# Patient Record
Sex: Female | Born: 1965
Health system: Southern US, Community
[De-identification: ages and names within clinical notes are randomized; demographics above are authoritative.]

## PROBLEM LIST (undated history)

## (undated) DIAGNOSIS — IMO0002 Reserved for concepts with insufficient information to code with codable children: Secondary | ICD-10-CM

## (undated) DIAGNOSIS — T4145XA Adverse effect of unspecified anesthetic, initial encounter: Secondary | ICD-10-CM

## (undated) DIAGNOSIS — R112 Nausea with vomiting, unspecified: Secondary | ICD-10-CM

## (undated) DIAGNOSIS — F419 Anxiety disorder, unspecified: Secondary | ICD-10-CM

## (undated) DIAGNOSIS — G709 Myoneural disorder, unspecified: Secondary | ICD-10-CM

## (undated) DIAGNOSIS — Z9889 Other specified postprocedural states: Secondary | ICD-10-CM

## (undated) DIAGNOSIS — T8859XA Other complications of anesthesia, initial encounter: Secondary | ICD-10-CM

## (undated) DIAGNOSIS — Z87898 Personal history of other specified conditions: Secondary | ICD-10-CM

## (undated) DIAGNOSIS — Z8669 Personal history of other diseases of the nervous system and sense organs: Secondary | ICD-10-CM

## (undated) DIAGNOSIS — B379 Candidiasis, unspecified: Secondary | ICD-10-CM

## (undated) DIAGNOSIS — K589 Irritable bowel syndrome without diarrhea: Secondary | ICD-10-CM

## (undated) HISTORY — DX: Anxiety disorder, unspecified: F41.9

## (undated) HISTORY — PX: WISDOM TOOTH EXTRACTION: SHX21

## (undated) HISTORY — DX: Reserved for concepts with insufficient information to code with codable children: IMO0002

## (undated) HISTORY — DX: Personal history of other diseases of the nervous system and sense organs: Z86.69

## (undated) HISTORY — DX: Candidiasis, unspecified: B37.9

## (undated) HISTORY — DX: Personal history of other specified conditions: Z87.898

## (undated) HISTORY — DX: Irritable bowel syndrome without diarrhea: K58.9

---
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
## 1998-01-28 ENCOUNTER — Ambulatory Visit (HOSPITAL_COMMUNITY): Admission: RE | Admit: 1998-01-28 | Discharge: 1998-01-28 | Payer: Self-pay | Admitting: *Deleted

## 1998-04-24 ENCOUNTER — Other Ambulatory Visit: Admission: RE | Admit: 1998-04-24 | Discharge: 1998-04-24 | Payer: Self-pay | Admitting: *Deleted

## 1999-04-28 ENCOUNTER — Other Ambulatory Visit: Admission: RE | Admit: 1999-04-28 | Discharge: 1999-04-28 | Payer: Self-pay | Admitting: *Deleted

## 2000-07-05 ENCOUNTER — Other Ambulatory Visit: Admission: RE | Admit: 2000-07-05 | Discharge: 2000-07-05 | Payer: Self-pay | Admitting: *Deleted

## 2001-07-27 ENCOUNTER — Other Ambulatory Visit: Admission: RE | Admit: 2001-07-27 | Discharge: 2001-07-27 | Payer: Self-pay | Admitting: *Deleted

## 2002-08-14 ENCOUNTER — Other Ambulatory Visit: Admission: RE | Admit: 2002-08-14 | Discharge: 2002-08-14 | Payer: Self-pay | Admitting: *Deleted

## 2002-10-11 ENCOUNTER — Ambulatory Visit (HOSPITAL_COMMUNITY): Admission: RE | Admit: 2002-10-11 | Discharge: 2002-10-11 | Payer: Self-pay | Admitting: Family Medicine

## 2002-10-11 ENCOUNTER — Encounter: Payer: Self-pay | Admitting: Family Medicine

## 2003-02-27 ENCOUNTER — Other Ambulatory Visit: Admission: RE | Admit: 2003-02-27 | Discharge: 2003-02-27 | Payer: Self-pay | Admitting: *Deleted

## 2003-08-11 DIAGNOSIS — IMO0002 Reserved for concepts with insufficient information to code with codable children: Secondary | ICD-10-CM

## 2003-08-11 DIAGNOSIS — R87619 Unspecified abnormal cytological findings in specimens from cervix uteri: Secondary | ICD-10-CM

## 2003-08-11 DIAGNOSIS — Z8669 Personal history of other diseases of the nervous system and sense organs: Secondary | ICD-10-CM

## 2003-08-11 HISTORY — DX: Reserved for concepts with insufficient information to code with codable children: IMO0002

## 2003-08-11 HISTORY — DX: Unspecified abnormal cytological findings in specimens from cervix uteri: R87.619

## 2003-08-11 HISTORY — DX: Personal history of other diseases of the nervous system and sense organs: Z86.69

## 2003-09-17 ENCOUNTER — Other Ambulatory Visit: Admission: RE | Admit: 2003-09-17 | Discharge: 2003-09-17 | Payer: Self-pay | Admitting: *Deleted

## 2004-12-01 ENCOUNTER — Other Ambulatory Visit: Admission: RE | Admit: 2004-12-01 | Discharge: 2004-12-01 | Payer: Self-pay | Admitting: Obstetrics and Gynecology

## 2005-02-12 ENCOUNTER — Ambulatory Visit (HOSPITAL_COMMUNITY): Admission: RE | Admit: 2005-02-12 | Discharge: 2005-02-12 | Payer: Self-pay | Admitting: Obstetrics and Gynecology

## 2006-01-19 ENCOUNTER — Other Ambulatory Visit: Admission: RE | Admit: 2006-01-19 | Discharge: 2006-01-19 | Payer: Self-pay | Admitting: Obstetrics and Gynecology

## 2006-02-16 ENCOUNTER — Ambulatory Visit (HOSPITAL_COMMUNITY): Admission: RE | Admit: 2006-02-16 | Discharge: 2006-02-16 | Payer: Self-pay | Admitting: Obstetrics and Gynecology

## 2007-02-18 ENCOUNTER — Ambulatory Visit (HOSPITAL_COMMUNITY): Admission: RE | Admit: 2007-02-18 | Discharge: 2007-02-18 | Payer: Self-pay | Admitting: Obstetrics and Gynecology

## 2007-08-11 DIAGNOSIS — K589 Irritable bowel syndrome without diarrhea: Secondary | ICD-10-CM

## 2007-08-11 HISTORY — DX: Irritable bowel syndrome, unspecified: K58.9

## 2008-03-01 ENCOUNTER — Ambulatory Visit (HOSPITAL_COMMUNITY): Admission: RE | Admit: 2008-03-01 | Discharge: 2008-03-01 | Payer: Self-pay | Admitting: Obstetrics and Gynecology

## 2009-08-10 DIAGNOSIS — Z87898 Personal history of other specified conditions: Secondary | ICD-10-CM

## 2009-08-10 HISTORY — DX: Personal history of other specified conditions: Z87.898

## 2010-11-27 ENCOUNTER — Other Ambulatory Visit (HOSPITAL_COMMUNITY): Payer: Self-pay | Admitting: Obstetrics and Gynecology

## 2010-11-27 DIAGNOSIS — Z1231 Encounter for screening mammogram for malignant neoplasm of breast: Secondary | ICD-10-CM

## 2010-11-28 ENCOUNTER — Ambulatory Visit (HOSPITAL_COMMUNITY)
Admission: RE | Admit: 2010-11-28 | Discharge: 2010-11-28 | Disposition: A | Discharge status: Home / Self Care | Payer: 59 | Source: Ambulatory Visit | Attending: Obstetrics and Gynecology | Admitting: Obstetrics and Gynecology

## 2010-11-28 DIAGNOSIS — Z1231 Encounter for screening mammogram for malignant neoplasm of breast: Secondary | ICD-10-CM | POA: Insufficient documentation

## 2011-11-24 ENCOUNTER — Telehealth: Payer: Self-pay | Admitting: Obstetrics and Gynecology

## 2011-11-24 NOTE — Telephone Encounter (Signed)
Routed to chandra 

## 2011-11-25 NOTE — Telephone Encounter (Signed)
LM ON VM TO CB PER TELEPHONE CALL.  

## 2011-11-26 ENCOUNTER — Other Ambulatory Visit: Payer: Self-pay

## 2011-11-26 NOTE — Telephone Encounter (Signed)
TC FROM PT. PT WANTS RX RF FOR YAZ. PT STATES,"TRIED LOLOESTRIN PRESCRIBED BY [facility];HOWEVER D/C X AGO DUE TO INC COST. PT IMMEDIATELY RESTARTED YAZ (PT HAD 2 PACKS LEFT FROM PREV RX) AND IS CURRENTLY TAKING. DENIES MISSING ANY PILLS.  WILL CONSULT WITH [facility] PER RF REQ. PT AGREES. PT NEEDS RF THIS WEEK.

## 2011-11-30 MED ORDER — DROSPIRENONE-ETHINYL ESTRADIOL 3-0.02 MG PO TABS: 1.0000 | ORAL_TABLET | Freq: Every day | ORAL | Status: DC

## 2011-11-30 NOTE — Telephone Encounter (Signed)
TC FROM PT. RX YAZ CALLED TO PHARM WITH RF'S THRU 02/2012. PT AGREES.

## 2012-01-06 ENCOUNTER — Telehealth: Payer: Self-pay | Admitting: Obstetrics and Gynecology

## 2012-01-06 NOTE — Telephone Encounter (Signed)
Triage/cht received 

## 2012-01-06 NOTE — Telephone Encounter (Signed)
[facility] pt 

## 2012-01-07 NOTE — Telephone Encounter (Signed)
Lm with female for pt to cb per telephone call.

## 2012-01-08 NOTE — Telephone Encounter (Signed)
Pc to pt per telephone call. No answer from pt and voicemail not available.

## 2012-01-08 NOTE — Telephone Encounter (Signed)
Lauren Sutton received and addressing.

## 2012-01-08 NOTE — Telephone Encounter (Signed)
Tc from pt. Pt states,"has missed 2 weeks of her bcp's due to forgetting to take them with her on vacation. Pt only uses bcp's for regulation of menses,not bc. Pt advised to wait until next cycle to restart meds.  Pt voices understanding.

## 2012-03-09 ENCOUNTER — Ambulatory Visit (INDEPENDENT_AMBULATORY_CARE_PROVIDER_SITE_OTHER): Payer: 59 | Admitting: Obstetrics and Gynecology

## 2012-03-09 ENCOUNTER — Encounter: Payer: Self-pay | Admitting: Obstetrics and Gynecology

## 2012-03-09 VITALS — BP 102/60 | Ht 60.5 in | Wt 132.0 lb

## 2012-03-09 DIAGNOSIS — B49 Unspecified mycosis: Secondary | ICD-10-CM

## 2012-03-09 DIAGNOSIS — R6889 Other general symptoms and signs: Secondary | ICD-10-CM

## 2012-03-09 DIAGNOSIS — F411 Generalized anxiety disorder: Secondary | ICD-10-CM

## 2012-03-09 DIAGNOSIS — K589 Irritable bowel syndrome without diarrhea: Secondary | ICD-10-CM | POA: Insufficient documentation

## 2012-03-09 DIAGNOSIS — B379 Candidiasis, unspecified: Secondary | ICD-10-CM | POA: Insufficient documentation

## 2012-03-09 DIAGNOSIS — F419 Anxiety disorder, unspecified: Secondary | ICD-10-CM | POA: Insufficient documentation

## 2012-03-09 DIAGNOSIS — IMO0002 Reserved for concepts with insufficient information to code with codable children: Secondary | ICD-10-CM

## 2012-03-09 DIAGNOSIS — Z87898 Personal history of other specified conditions: Secondary | ICD-10-CM

## 2012-03-09 DIAGNOSIS — Z8669 Personal history of other diseases of the nervous system and sense organs: Secondary | ICD-10-CM | POA: Insufficient documentation

## 2012-03-09 DIAGNOSIS — N6459 Other signs and symptoms in breast: Secondary | ICD-10-CM

## 2012-03-09 NOTE — Progress Notes (Signed)
AEX  Last Pap: 02/26/2010 WNL: Yes  No hx of abnl Regular Periods:yes Contraception: None  Monthly Breast exam:no Tetanus<75yrs: unsure Nl.Bladder Function:yes Daily BMs:yes Healthy Diet:yes Calcium:no Mammogram:yes Date of Mammogram: 11/28/2010 Exercise:yes Have often Exercise: 6 times weekly Seatbelt: yes Abuse at home: no Stressful work:yes Sigmoid-colonoscopy: n/a Bone Density: No PCP: Mary Previst Change in PMH: none Change in [facility]: none Subjective:    Lauren Sutton is a 46 y.o. female, G2P2002, who presents for an annual exam. She denies any hot flashes, has some insomnia occassionally    History   Social History  . Marital Status: Married    Spouse Name: N/A    Number of Children: N/A  . Years of Education: N/A   Social History Main Topics  . Smoking status: Never Smoker   . Smokeless tobacco: Never Used  . Alcohol Use: No  . Drug Use: No  . Sexually Active: Yes    Birth Control/ Protection: None   Other Topics Concern  . None   Social History Narrative  . None    Menstrual cycle:   LMP: Patient's last menstrual period was 02/19/2012.           Cycle: Very light.  Pt decided to d/c BCPs because she doesn't need contraception.    The following portions of the patient's history were reviewed and updated as appropriate: allergies, current medications, past family history, past medical history, past social history, past surgical history and problem list.  Review of Systems Pertinent items are noted in HPI. Breast:Negative for breast lump,nipple discharge or nipple retraction Gastrointestinal: Negative for abdominal pain, change in bowel habits or rectal bleeding Urinary:negative   Objective:    BP 102/60  Ht 5' 0.5" (1.537 m)  Wt 132 lb (59.875 kg)  BMI 25.36 kg/m2  LMP 02/19/2012    Weight:  Wt Readings from Last 1 Encounters:  03/09/12 132 lb (59.875 kg)          BMI: Body mass index is 25.36 kg/(m^2).  General Appearance: Alert,  appropriate appearance for age. No acute distress HEENT: Grossly normal Neck / Thyroid: Supple, no masses, nodes or enlargement Lungs: clear to auscultation bilaterally Back: No CVA tenderness Breast Exam: left breast with irregularity along the outer day which at approximately 2:00 with mild tenderness. No other breast irregularity. No dominant masses discharge skin changes. Cardiovascular: Regular rate and rhythm. S1, S2, no murmur Gastrointestinal: Soft, non-tender, no masses or organomegaly Pelvic Exam: External genitalia: normal general appearance Vaginal: normal mucosa without prolapse or lesions Cervix: normal appearance Adnexa: normal bimanual exam Uterus: normal single, nontender Rectovaginal: normal rectal, no masses Lymphatic Exam: Non-palpable nodes in neck, clavicular, axillary, or inguinal regions Skin: no rash or abnormalities Neurologic: Normal gait and speech, no tremor  Psychiatric: Alert and oriented, appropriate affect.   Wet Prep:not applicable Urinalysis:not applicable UPT: Not done   Assessment:    Normal gyn exam perimenopausal timeframe without symptoms.    Plan:    Mammogram, diagnostic ordered pap smear due 2014 return annually or prn STD screening: declined Contraception:no method declined      Camaron Cammack PMD

## 2012-03-17 ENCOUNTER — Ambulatory Visit: Payer: Self-pay | Admitting: Obstetrics and Gynecology

## 2012-03-22 ENCOUNTER — Other Ambulatory Visit: Payer: Self-pay | Admitting: Obstetrics and Gynecology

## 2012-03-22 ENCOUNTER — Ambulatory Visit
Admission: RE | Admit: 2012-03-22 | Discharge: 2012-03-22 | Disposition: A | Discharge status: Home / Self Care | Payer: 59 | Source: Ambulatory Visit | Attending: Obstetrics and Gynecology | Admitting: Obstetrics and Gynecology

## 2012-03-22 DIAGNOSIS — N6459 Other signs and symptoms in breast: Secondary | ICD-10-CM

## 2013-08-29 ENCOUNTER — Other Ambulatory Visit: Payer: Self-pay | Admitting: Obstetrics and Gynecology

## 2013-08-29 DIAGNOSIS — Z1231 Encounter for screening mammogram for malignant neoplasm of breast: Secondary | ICD-10-CM

## 2013-09-01 ENCOUNTER — Ambulatory Visit (HOSPITAL_COMMUNITY)
Admission: RE | Admit: 2013-09-01 | Discharge: 2013-09-01 | Disposition: A | Discharge status: Home / Self Care | Payer: 59 | Source: Ambulatory Visit | Attending: Obstetrics and Gynecology | Admitting: Obstetrics and Gynecology

## 2013-09-01 DIAGNOSIS — Z1231 Encounter for screening mammogram for malignant neoplasm of breast: Secondary | ICD-10-CM

## 2014-06-11 ENCOUNTER — Encounter: Payer: Self-pay | Admitting: Obstetrics and Gynecology

## 2016-05-12 ENCOUNTER — Ambulatory Visit (INDEPENDENT_AMBULATORY_CARE_PROVIDER_SITE_OTHER): Payer: Managed Care, Other (non HMO) | Admitting: Family Medicine

## 2016-05-12 ENCOUNTER — Encounter: Payer: Self-pay | Admitting: Family Medicine

## 2016-05-12 VITALS — BP 98/64 | HR 86 | Temp 97.7°F | Resp 12 | Ht 60.5 in | Wt 132.0 lb

## 2016-05-12 DIAGNOSIS — Z1211 Encounter for screening for malignant neoplasm of colon: Secondary | ICD-10-CM

## 2016-05-12 DIAGNOSIS — Z0000 Encounter for general adult medical examination without abnormal findings: Secondary | ICD-10-CM

## 2016-05-12 DIAGNOSIS — Z13220 Encounter for screening for lipoid disorders: Secondary | ICD-10-CM | POA: Diagnosis not present

## 2016-05-12 DIAGNOSIS — Z23 Encounter for immunization: Secondary | ICD-10-CM | POA: Diagnosis not present

## 2016-05-12 DIAGNOSIS — Z1159 Encounter for screening for other viral diseases: Secondary | ICD-10-CM

## 2016-05-12 DIAGNOSIS — Z131 Encounter for screening for diabetes mellitus: Secondary | ICD-10-CM | POA: Diagnosis not present

## 2016-05-12 NOTE — Progress Notes (Signed)
Pre visit review using our clinic review tool, if applicable. No additional management support is needed unless otherwise documented below in the visit note. 

## 2016-05-12 NOTE — Patient Instructions (Addendum)
A few things to remember from today's visit:   Routine general medical examination at a health care facility - Plan: Lipid panel, Basic Metabolic Panel, Hemoglobin A1c  Lipid screening - Plan: Lipid panel  Diabetes mellitus screening - Plan: Basic Metabolic Panel, Hemoglobin A1c  Colon cancer screening - Plan: Ambulatory referral to Gastroenterology    At least 150 minutes of moderate exercise per week, daily brisk walking for 15-30 min is a good exercise option. Healthy diet low in saturated (animal) fats and sweets and consisting of fresh fruits and vegetables, lean meats such as fish and white chicken and whole grains.   - Vaccines:  Tdap vaccine every 10 years. Given today. Consider annual flu vaccine.  Shingles vaccine recommended at age 52, could be given after 52 years of age but not sure about insurance coverage.  Pneumonia vaccines:  Prevnar 13 at 65 and Pneumovax at 66.  Screening recommendations for low/normal risk women:  Screening for diabetes at age 52-45 and every 3 years.  Cervical cancer prevention:   Pap smear every 3-5 years between women 30 and older if pap smear negative and HPV screening negative. Continue following with gyn.  -Breast cancer: Mammogram: There is disagreement between experts about when to start screening in low risk asymptomatic female but recent recommendations are to start screening at 6840 and not later than 52 years old , every 1-2 years and after 52 yo q 2 years. Screening is recommended until 52 years old but some women can continue screening depending of healthy issues.   Colon cancer screening: starts at 52 years old until 52 years old. Colonoscopy is the ideal, if normal it can be repeated every 10 years.  Immunochemical test (FIT): Hemoccult cards, very sensitive if all 3 cards are done. If negative it can be repeated annually. If positive a colonoscopy should follow.   Cologuard: Intended for patients with average risk factors  for colon cancer from 4950-52 years old. Anyone with prior history of colon polyps or family history of colonic neoplasia is NOT average risk. May be repeated every 3 years.  It is not as sensitive in older patients. Cost is around  $ 649. If positive a diagnostic colonoscopy must be done.    Cholesterol disorder screening at age 52 and every 3 years.     If a new problem present, please set up appointment sooner than planned today.

## 2016-05-12 NOTE — Progress Notes (Signed)
HPI:   [name] is a 50 y.o. female, who is here today to establish care with me, she also was planning on her routine physical today.    Former PCP: N/A Last preventive routine visit: 7-8 years ago  She follows with her gynecologist annually, Lauren Sutton. According to patient,  in the past she has had some blood work done and has been otherwise negative.  She is overall healthy, she is not on chronic medications.  Concerns today: none  Lives with husband and a daughter.  She exercises regularly, walks daily, and tries to follow a healthy diet.  Colonoscopy: has not had one before.  Denies abdominal pain, nausea, vomiting, changes in bowel habits, blood in stool or melena.  Tdap not sure. She is not interested in influenza vaccine.    Review of Systems  Constitutional: Negative for appetite change, fatigue and fever.  HENT: Negative for hearing loss, mouth sores, trouble swallowing and voice change.   Eyes: Negative for photophobia and visual disturbance.  Respiratory: Negative for cough, shortness of breath and wheezing.   Cardiovascular: Negative for chest pain and leg swelling.  Gastrointestinal: Negative for abdominal pain, nausea and vomiting.       No changes in bowel habits.  Endocrine: Negative for cold intolerance and heat intolerance.  Genitourinary: Negative for decreased urine volume, dysuria, hematuria, vaginal bleeding and vaginal discharge.  Musculoskeletal: Negative for arthralgias, back pain and neck pain.  Skin: Negative for color change and rash.  Neurological: Negative for seizures, syncope, weakness, numbness and headaches.  Psychiatric/Behavioral: Negative for confusion and sleep disturbance. The patient is not nervous/anxious.   All other systems reviewed and are negative.     No current outpatient prescriptions on file prior to visit.   No current facility-administered medications on file prior to visit.      Past Medical  History:  Diagnosis Date  . Abnormal Pap smear 2005  . Anxiety   . H/O insomnia 2011  . H/O migraine 2005  . H/O migraine   . IBS (irritable bowel syndrome) 2009  . Yeast infection    No Known Allergies  Family History  Problem Relation Age of Onset  . Cancer Paternal Grandmother     Breast  . Hypertension Mother   . Stroke Mother     Social History   Social History  . Marital status: Married    Spouse name: N/A  . Number of children: N/A  . Years of education: N/A   Social History Main Topics  . Smoking status: Never Smoker  . Smokeless tobacco: Never Used  . Alcohol use No  . Drug use: No  . Sexual activity: Yes    Birth control/ protection: None   Other Topics Concern  . None   Social History Narrative  . None    Vitals:   05/12/16 0756  BP: 98/64  Pulse: 86  Resp: 12  Temp: 97.7 F (36.5 C)   O2 sat at RA 97%  Body mass index is 25.36 kg/m.    Physical Exam  Nursing note and vitals reviewed. Constitutional: She is oriented to person, place, and time. She appears well-developed. No distress.  HENT:  Head: Atraumatic.  Right Ear: Hearing, tympanic membrane, external ear and ear canal normal.  Left Ear: Hearing, tympanic membrane, external ear and ear canal normal.  Mouth/Throat: Uvula is midline, oropharynx is clear and moist and mucous membranes are normal.  Eyes: Conjunctivae and EOM are normal.  Pupils are equal, round, and reactive to light.  Neck: No thyroid mass and no thyromegaly present.  Cardiovascular: Normal rate and regular rhythm.   No murmur heard. Pulses:      Dorsalis pedis pulses are 2+ on the right side, and 2+ on the left side.       Posterior tibial pulses are 2+ on the right side, and 2+ on the left side.  Respiratory: Effort normal and breath sounds normal. No respiratory distress.  GI: Soft. She exhibits no mass. There is no hepatomegaly. There is no tenderness.  Musculoskeletal: She exhibits no edema or tenderness.    No major deformity or sing of synovitis appreciated.  Lymphadenopathy:    She has no cervical adenopathy.       Right: No supraclavicular adenopathy present.       Left: No supraclavicular adenopathy present.  Neurological: She is alert and oriented to person, place, and time. She has normal strength. No cranial nerve deficit. Coordination and gait normal.  Reflex Scores:      Bicep reflexes are 2+ on the right side and 2+ on the left side.      Patellar reflexes are 2+ on the right side and 2+ on the left side. Skin: Skin is warm. No rash noted. No erythema.  Psychiatric: She has a normal mood and affect. Her speech is normal.  Well groomed, good eye contact.      ASSESSMENT AND PLAN:     Lauren SorrowOlga was seen today for new patient (initial visit).  Diagnoses and all orders for this visit:  Routine general medical examination at a health care facility   We discussed the importance of regular physical activity and healthy diet for prevention of chronic illness and/or complications. Preventive guidelines reviewed. Continue following with gyn for female prevention care. Vaccination updated, does not want Flu vaccine. Evidence about Aspirin for primary prevention discussed. Ca++ and vit D supplementation recommended. Next CPE in 1-2 years.   -     Lipid panel; Future -     Basic Metabolic Panel; Future -     Hemoglobin A1c; Future -     Hep C Antibody; Future  Lipid screening -     Cancel: Lipid panel -     Lipid panel; Future  Diabetes mellitus screening -     Cancel: Basic Metabolic Panel -     Cancel: Hemoglobin A1c -     Basic Metabolic Panel; Future -     Hemoglobin A1c; Future  Colon cancer screening -     Ambulatory referral to Gastroenterology  Need for Tdap vaccination -     Tdap vaccine greater than or equal to 7yo IM  Encounter for hepatitis C screening test for low risk patient  -     Hep C Antibody; Future    -Today she is not fasting, so she will  be back for fasting labs next week.    Mostyn Varnell G. [place], MD  [facility]. [place] office.

## 2016-06-01 ENCOUNTER — Other Ambulatory Visit (INDEPENDENT_AMBULATORY_CARE_PROVIDER_SITE_OTHER): Payer: Managed Care, Other (non HMO)

## 2016-06-01 DIAGNOSIS — Z13220 Encounter for screening for lipoid disorders: Secondary | ICD-10-CM | POA: Diagnosis not present

## 2016-06-01 DIAGNOSIS — Z1159 Encounter for screening for other viral diseases: Secondary | ICD-10-CM

## 2016-06-01 DIAGNOSIS — Z131 Encounter for screening for diabetes mellitus: Secondary | ICD-10-CM

## 2016-06-01 DIAGNOSIS — Z0000 Encounter for general adult medical examination without abnormal findings: Secondary | ICD-10-CM | POA: Diagnosis not present

## 2016-06-01 LAB — LIPID PANEL
CHOL/HDL RATIO: 3
Cholesterol: 190 mg/dL (ref 0–200)
HDL: 61.2 mg/dL (ref >39.00)
LDL CALC: 114 mg/dL — AB (ref 0–99)
NONHDL: 128.52
TRIGLYCERIDES: 71 mg/dL (ref 0.0–149.0)
VLDL: 14.2 mg/dL (ref 0.0–40.0)

## 2016-06-01 LAB — BASIC METABOLIC PANEL
BUN: 16 mg/dL (ref 6–23)
CALCIUM: 9.5 mg/dL (ref 8.4–10.5)
CHLORIDE: 104 meq/L (ref 96–112)
CO2: 29 mEq/L (ref 19–32)
CREATININE: 0.79 mg/dL (ref 0.40–1.20)
GFR: 80.98 mL/min (ref >60.00)
Glucose, Bld: 86 mg/dL (ref 70–99)
Potassium: 4.3 mEq/L (ref 3.5–5.1)
Sodium: 139 mEq/L (ref 135–145)

## 2016-06-01 LAB — HEPATITIS C ANTIBODY: HCV AB: NEGATIVE

## 2016-06-01 LAB — HEMOGLOBIN A1C: HEMOGLOBIN A1C: 5.8 % (ref 4.6–6.5)

## 2016-06-12 ENCOUNTER — Encounter: Payer: Self-pay | Admitting: Family Medicine

## 2017-02-02 ENCOUNTER — Encounter: Payer: Self-pay | Admitting: Family Medicine

## 2017-02-02 ENCOUNTER — Ambulatory Visit (INDEPENDENT_AMBULATORY_CARE_PROVIDER_SITE_OTHER): Payer: Managed Care, Other (non HMO) | Admitting: Family Medicine

## 2017-02-02 VITALS — BP 112/70 | HR 67 | Temp 97.7°F | Resp 12 | Ht 60.5 in | Wt 143.0 lb

## 2017-02-02 DIAGNOSIS — J069 Acute upper respiratory infection, unspecified: Secondary | ICD-10-CM | POA: Diagnosis not present

## 2017-02-02 DIAGNOSIS — R0981 Nasal congestion: Secondary | ICD-10-CM

## 2017-02-02 MED ORDER — AMOXICILLIN-POT CLAVULANATE 875-125 MG PO TABS: 1.0000 | ORAL_TABLET | Freq: Two times a day (BID) | ORAL | 0 refills | Status: AC

## 2017-02-02 MED ORDER — PREDNISONE 20 MG PO TABS: 40.0000 mg | ORAL_TABLET | Freq: Every day | ORAL | 0 refills | Status: AC

## 2017-02-02 NOTE — Patient Instructions (Signed)
A few things to remember from today's visit:   Nasal sinus congestion - Plan: predniSONE (DELTASONE) 20 MG tablet  URI, acute  viral infections are self-limited and we treat each symptom depending of severity.  Over the counter medications as decongestants and cold medications usually help, they need to be taken with caution if there is a history of high blood pressure or palpitations. Tylenol and/or Ibuprofen also helps with most symptoms (headache, muscle aching, fever,etc) Plenty of fluids. Honey helps with cough. Steam inhalations helps with runny nose, nasal congestion, and may prevent sinus infections. Cough and nasal congestion could last a few days and sometimes weeks. Please follow in not any better in 2 weeks or if symptoms get worse.   Viral infections do not respond to antibiotic treatments and DO NOT prevent for bacterial over infection. A viral illness can cause fever that last 4-7 days in average.  Antibiotics also have side effects that go from gastrointestinal symptoms (diarrhea,nausea,bloating sensation) to bacterial resistance , and even serious illness as C. Diff  Infections.  If you are not feeling ANY better in 3-4 days you can fill antibiotic,even thought viral illness can take longer to resolve.  Over-the-counter antihistaminic, Allegra 180 mg daily. Steroid nasal spray, Flonase 1 spray in each nostril twice daily.  Please be sure medication list is accurate.

## 2017-02-02 NOTE — Progress Notes (Signed)
HPI:  ACUTE VISIT  Chief Complaint  Patient presents with  . Sinusitis    Lauren Sutton is a 51 y.o.female here today complaining of 5 days of "head congestion." She thinks she may have a sinus infection.  Her daughter was recently seen with similar symptoms and prescribed abx treatment. Problem started with sore throat, which have improved.  URI   This is a new problem. The current episode started in the past 7 days. The problem has been gradually worsening. There has been no fever. Associated symptoms include congestion, coughing, ear pain, headaches (pressure sensation), rhinorrhea and a sore throat. Pertinent negatives include no abdominal pain, diarrhea, nausea, neck pain, rash, swollen glands, vomiting or wheezing. She has tried decongestant for the symptoms. The treatment provided no relief.   Pressure frontal headache, worse in the morning, mild. No associated nausea, vomiting, or visual changes. "Little" non productive cough.  No Hx of recent travel. No known insect bite.  Hx of allergies: Denies  OTC medications for this problem: Alka Seltzer cold and sinus and nasal Afrin.   Review of Systems  Constitutional: Positive for fatigue. Negative for activity change, appetite change and fever.  HENT: Positive for congestion, ear pain, postnasal drip, rhinorrhea, sinus pressure and sore throat. Negative for facial swelling, hearing loss, mouth sores, trouble swallowing and voice change.   Eyes: Negative for discharge, redness and visual disturbance.  Respiratory: Positive for cough. Negative for chest tightness, shortness of breath and wheezing.   Gastrointestinal: Negative for abdominal pain, diarrhea, nausea and vomiting.  Musculoskeletal: Negative for myalgias and neck pain.  Skin: Negative for rash.  Allergic/Immunologic: Negative for environmental allergies.  Neurological: Positive for headaches (pressure sensation). Negative for syncope and weakness.    Hematological: Negative for adenopathy. Does not bruise/bleed easily.  Psychiatric/Behavioral: Negative for confusion. The patient is nervous/anxious.     No current outpatient prescriptions on file prior to visit.   No current facility-administered medications on file prior to visit.      Past Medical History:  Diagnosis Date  . Abnormal Pap smear 2005  . Anxiety   . H/O insomnia 2011  . H/O migraine 2005  . H/O migraine   . IBS (irritable bowel syndrome) 2009  . Yeast infection    No Known Allergies  Social History   Social History  . Marital status: Married    Spouse name: N/A  . Number of children: N/A  . Years of education: N/A   Social History Main Topics  . Smoking status: Never Smoker  . Smokeless tobacco: Never Used  . Alcohol use No  . Drug use: No  . Sexual activity: Yes    Birth control/ protection: None   Other Topics Concern  . None   Social History Narrative  . None    Vitals:   02/02/17 1557  BP: 112/70  Pulse: 67  Resp: 12  Temp: 97.7 F (36.5 C)  O2 sat at RA 96% Body mass index is 27.47 kg/m.  Physical Exam  Nursing note and vitals reviewed. Constitutional: She is oriented to person, place, and time. She appears well-developed. She does not appear ill. No distress.  HENT:  Head: Atraumatic.  Right Ear: Tympanic membrane, external ear and ear canal normal.  Left Ear: Tympanic membrane, external ear and ear canal normal.  Nose: Rhinorrhea present. Right sinus exhibits no maxillary sinus tenderness and no frontal sinus tenderness. Left sinus exhibits no maxillary sinus tenderness and no frontal sinus  tenderness.  Mouth/Throat: Oropharynx is clear and moist and mucous membranes are normal.  Hypertrophic turbinates. Postnasal drainage.  Eyes: Conjunctivae and EOM are normal.  Neck: No muscular tenderness present. No edema and no erythema present.  Cardiovascular: Normal rate and regular rhythm.   No murmur heard. Respiratory:  Effort normal and breath sounds normal. No stridor. No respiratory distress.  Lymphadenopathy:       Head (right side): No submandibular adenopathy present.       Head (left side): No submandibular adenopathy present.    She has cervical adenopathy (< 1 cm, no tender).       Right cervical: Posterior cervical adenopathy present.       Left cervical: Posterior cervical adenopathy present.  Neurological: She is alert and oriented to person, place, and time. She has normal strength. Gait normal.  Skin: Skin is warm. No rash noted. No erythema.  Psychiatric: She has a normal mood and affect.  Well groomed, good eye contact.     ASSESSMENT AND PLAN:   Diagnoses and all orders for this visit:  Nasal sinus congestion  She denies history of allergies but some findings on examination suggest possible allergic rhinitis. Explained that nasal congestion and cough after a URI my last a few weeks. I don't think antibiotics are necessary at this time. She still would like antibiotic prescription to be sent to the pharmacy, instructed not to fill Rx now, if not any better in 3-4 days or new onset of fever she can go ahead and fill prescription. She was educated about some side effects of antibiotic use. Prednisone might help, some side effects discussed. Also recommend OTC antihistaminic. Follow-up as needed.   -     predniSONE (DELTASONE) 20 MG tablet; Take 2 tablets (40 mg total) by mouth daily with breakfast.  URI, acute  Symptoms suggests a viral etiology, I explained patient that symptomatic treatment is usually recommended in this case, so I do not think abx will help. Instructed to monitor for signs of complications, instructed about warning signs.  F/U as needed.   Other orders -     amoxicillin-clavulanate (AUGMENTIN) 875-125 MG tablet; Take 1 tablet by mouth 2 (two) times daily.   25 min face to face OV. > 50% was dedicated to discussion of Dx, prognosis, treatment options, and  side effects of medications.Educated about treatment of viral illness and reassurance.    -Ms. Lauren Sutton was advised to seek attention immediately if symptoms worsen or to follow if they persist or new concerns arise.       Ainslee Sou G. [place], MD  [facility]. [place] office.

## 2017-03-15 ENCOUNTER — Encounter (HOSPITAL_COMMUNITY): Payer: Self-pay | Admitting: Emergency Medicine

## 2017-03-15 ENCOUNTER — Emergency Department (HOSPITAL_COMMUNITY)
Admission: EM | Admit: 2017-03-15 | Discharge: 2017-03-15 | Disposition: A | Discharge status: Home / Self Care | Payer: Managed Care, Other (non HMO) | Attending: Emergency Medicine | Admitting: Emergency Medicine

## 2017-03-15 DIAGNOSIS — S0185XA Open bite of other part of head, initial encounter: Secondary | ICD-10-CM

## 2017-03-15 DIAGNOSIS — Y929 Unspecified place or not applicable: Secondary | ICD-10-CM | POA: Insufficient documentation

## 2017-03-15 DIAGNOSIS — W540XXA Bitten by dog, initial encounter: Secondary | ICD-10-CM | POA: Diagnosis not present

## 2017-03-15 DIAGNOSIS — Z23 Encounter for immunization: Secondary | ICD-10-CM | POA: Insufficient documentation

## 2017-03-15 DIAGNOSIS — Y999 Unspecified external cause status: Secondary | ICD-10-CM | POA: Insufficient documentation

## 2017-03-15 DIAGNOSIS — S0181XA Laceration without foreign body of other part of head, initial encounter: Secondary | ICD-10-CM | POA: Diagnosis not present

## 2017-03-15 DIAGNOSIS — Y939 Activity, unspecified: Secondary | ICD-10-CM | POA: Insufficient documentation

## 2017-03-15 DIAGNOSIS — Z203 Contact with and (suspected) exposure to rabies: Secondary | ICD-10-CM | POA: Insufficient documentation

## 2017-03-15 LAB — I-STAT BETA HCG BLOOD, ED (MC, WL, AP ONLY): I-stat hCG, quantitative: <5 m[IU]/mL (ref <5)

## 2017-03-15 MED ORDER — RABIES IMMUNE GLOBULIN 150 UNIT/ML IM INJ
20.0000 [IU]/kg | INJECTION | Freq: Once | INTRAMUSCULAR | Status: AC
  Administered 2017-03-15: 1275 [IU] via INTRAMUSCULAR
  Filled 2017-03-15: qty 8.5

## 2017-03-15 MED ORDER — RABIES VACCINE, PCEC IM SUSR
1.0000 mL | Freq: Once | INTRAMUSCULAR | Status: AC
  Administered 2017-03-15: 1 mL via INTRAMUSCULAR
  Filled 2017-03-15: qty 1

## 2017-03-15 MED ORDER — TETANUS-DIPHTH-ACELL PERTUSSIS 5-2.5-18.5 LF-MCG/0.5 IM SUSP
0.5000 mL | Freq: Once | INTRAMUSCULAR | Status: AC
  Administered 2017-03-15: 0.5 mL via INTRAMUSCULAR
  Filled 2017-03-15: qty 0.5

## 2017-03-15 MED ORDER — AMOXICILLIN-POT CLAVULANATE 875-125 MG PO TABS
1.0000 | ORAL_TABLET | Freq: Once | ORAL | Status: AC
  Administered 2017-03-15: 1 via ORAL
  Filled 2017-03-15: qty 1

## 2017-03-15 MED ORDER — AMOXICILLIN-POT CLAVULANATE 875-125 MG PO TABS: 1.0000 | ORAL_TABLET | Freq: Two times a day (BID) | ORAL | 0 refills | Status: DC

## 2017-03-15 NOTE — ED Provider Notes (Signed)
[facility]-EMERGENCY DEPT Provider Note   CSN: [id number] Arrival date & time: 03/15/17  1007  By signing my name below, I, Rosana Fretana Waskiewicz, attest that this documentation has been prepared under the direction and in the presence of non-physician practitioner, Lyndel SafeHammond, Elizabeth, PA-C. Electronically Signed: Rosana Fretana Waskiewicz, ED Scribe. 03/15/17. 12:07 PM.  History   Chief Complaint Chief Complaint  Patient presents with  . Animal Bite   The history is provided by the patient.   HPI Comments: Lauren Sutton is a 51 y.o. female who presents to the Emergency Department requesting evaluation of a dog bite sustained earlier today. Pt states that the dog was abandoned and it bit her when she was feeding it. The dogs vaccination status is unknown. The pt's tetanus status is unknown. Pt's wounds are located just right of the nose and on the left side of the chin. No other complaints at this time.  Past Medical History:  Diagnosis Date  . Abnormal Pap smear 2005  . Anxiety   . H/O insomnia 2011  . H/O migraine 2005  . H/O migraine   . IBS (irritable bowel syndrome) 2009  . Yeast infection     Patient Active Problem List   Diagnosis Date Noted  . Abnormal Pap smear   . Yeast infection   . H/O migraine   . IBS (irritable bowel syndrome)   . Anxiety   . H/O insomnia     Past Surgical History:  Procedure Laterality Date  . WISDOM TOOTH EXTRACTION      OB History    Gravida Para Term Preterm AB Living   2 2 2     2    SAB TAB Ectopic Multiple Live Births           2       Home Medications    Prior to Admission medications   Medication Sig Start Date End Date Taking? Authorizing Provider  amoxicillin-clavulanate (AUGMENTIN) 875-125 MG tablet Take 1 tablet by mouth every 12 (twelve) hours. 03/15/17   Cristina GongHammond, Elizabeth W, PA-C    Family History Family History  Problem Relation Age of Onset  . Cancer Paternal Grandmother        Breast  . Hypertension Mother   . Stroke Mother       Social History Social History  Substance Use Topics  . Smoking status: Never Smoker  . Smokeless tobacco: Never Used  . Alcohol use No     Allergies   Patient has no known allergies.   Review of Systems Review of Systems  Constitutional: Negative for fever.  HENT: Negative for dental problem and facial swelling.   Skin: Positive for wound. Negative for color change and pallor.  Neurological: Negative for weakness and numbness.     Physical Exam Updated Vital Signs BP 125/82 (BP Location: Left Arm)   Pulse 77   Temp 98.2 F (36.8 C) (Oral)   Resp 18   Ht 5' 2.5" (1.588 m)   Wt 64 kg (141 lb 3.2 oz)   SpO2 97%   BMI 25.41 kg/m   Physical Exam  Constitutional: She appears well-developed and well-nourished. No distress.  HENT:  Head: Normocephalic.  Right Ear: External ear normal.  Left Ear: External ear normal.  Eyes: No scleral icterus.  Neck: Normal range of motion. No tracheal deviation present.  Pulmonary/Chest: Effort normal. No stridor.  Abdominal: She exhibits no distension.  Neurological: She is alert. No sensory deficit.  Skin: Skin is warm and dry. No  pallor.  5 mm laceration to the right alar crease. 5 mm puncture to the left jaw.   Psychiatric: She has a normal mood and affect. Her behavior is normal.  Nursing note and vitals reviewed.    ED Treatments / Results  DIAGNOSTIC STUDIES: Oxygen Saturation is 97% on RA, normal by my interpretation.   COORDINATION OF CARE: 12:01 PM-Discussed next steps with pt including updating Tetanus and rabies vaccinations. Pt verbalized understanding and is agreeable with the plan.   Labs (all labs ordered are listed, but only abnormal results are displayed) Labs Reviewed  I-STAT BETA HCG BLOOD, ED ([facility], [facility], [facility] ONLY)    EKG  EKG Interpretation None       Radiology No results found.  Procedures Procedures (including critical care time)  Medications Ordered in ED Medications  rabies immune  globulin (HYPERAB/KEDRAB) injection 1,275 Units (1,275 Units Intramuscular Given 03/15/17 1259)  rabies vaccine (RABAVERT) injection 1 mL (1 mL Intramuscular Given 03/15/17 1252)  Tdap (BOOSTRIX) injection 0.5 mL (0.5 mLs Intramuscular Given 03/15/17 1309)  amoxicillin-clavulanate (AUGMENTIN) 875-125 MG per tablet 1 tablet (1 tablet Oral Given 03/15/17 1311)     Initial Impression / Assessment and Plan / ED Course  I have reviewed the triage vital signs and the nursing notes.  Pertinent labs & imaging results that were available during my care of the patient were reviewed by me and considered in my medical decision making (see chart for details).    Patient presents with laceration from a dog bite.  Pt wounds irrigated and cleaned.  Wounds examined with visualization of the base and no foreign bodies seen.  Pt Alert and oriented, NAD, nontoxic, nonseptic appearing.  pt without neurologic deficit. Patient tetanus updated.  Patient rabies vaccine and immunoglobulin risk and benefit discussed.  Pt consents. Wounds not closed secondary to concern for infection. We'll discharge home with pain medication, Augmentin and requests for close follow-up with PCP or back in the ER.  Patient was given letter with specific date stating when she needs to receive additional rabies vaccines. Patient was given the option to ask questions, all of which were answered to the best of my abilities.   Final Clinical Impressions(s) / ED Diagnoses   Final diagnoses:  Dog bite of face, initial encounter    New Prescriptions Discharge Medication List as of 03/15/2017  1:13 PM    START taking these medications   Details  amoxicillin-clavulanate (AUGMENTIN) 875-125 MG tablet Take 1 tablet by mouth every 12 (twelve) hours., Starting Mon 03/15/2017, Print       I personally performed the services described in this documentation, which was scribed in my presence. The recorded information has been reviewed and is accurate.        Cristina Gong, PA-C 03/15/17 1357    Rolland Porter, MD 03/18/17 1037

## 2017-03-15 NOTE — ED Triage Notes (Signed)
Pt here s/t dog bite to face today. Was bit by abandoned pit bull mix that was tied up and abandoned.  Tetanus vaccine unknown. Vaccination status of dog unknown. Shallow minor puncture wounds to right lateral nose and left jaw.

## 2017-03-15 NOTE — ED Notes (Signed)
Bed: WTR5 Expected date:  Expected time:  Means of arrival:  Comments: 

## 2017-03-15 NOTE — Discharge Instructions (Signed)

## 2017-03-18 ENCOUNTER — Encounter (HOSPITAL_COMMUNITY): Payer: Self-pay | Admitting: *Deleted

## 2017-03-18 ENCOUNTER — Ambulatory Visit (HOSPITAL_COMMUNITY)
Admission: EM | Admit: 2017-03-18 | Discharge: 2017-03-18 | Disposition: A | Discharge status: Home / Self Care | Payer: Managed Care, Other (non HMO) | Attending: Internal Medicine | Admitting: Internal Medicine

## 2017-03-18 DIAGNOSIS — Z23 Encounter for immunization: Secondary | ICD-10-CM

## 2017-03-18 DIAGNOSIS — B379 Candidiasis, unspecified: Secondary | ICD-10-CM

## 2017-03-18 DIAGNOSIS — Z87898 Personal history of other specified conditions: Secondary | ICD-10-CM

## 2017-03-18 DIAGNOSIS — Z203 Contact with and (suspected) exposure to rabies: Secondary | ICD-10-CM | POA: Diagnosis not present

## 2017-03-18 DIAGNOSIS — F419 Anxiety disorder, unspecified: Secondary | ICD-10-CM

## 2017-03-18 DIAGNOSIS — Z8669 Personal history of other diseases of the nervous system and sense organs: Secondary | ICD-10-CM

## 2017-03-18 MED ORDER — RABIES VACCINE, PCEC IM SUSR
INTRAMUSCULAR | Status: AC
  Filled 2017-03-18: qty 1

## 2017-03-18 MED ORDER — RABIES VACCINE, PCEC IM SUSR
1.0000 mL | Freq: Once | INTRAMUSCULAR | Status: AC
  Administered 2017-03-18: 1 mL via INTRAMUSCULAR

## 2017-03-18 NOTE — ED Triage Notes (Signed)
Here for  Next in  Series  Of  Rabies  Vaccines

## 2017-03-18 NOTE — Discharge Instructions (Signed)
followup   As   Directed for  Next    In series  Of injections     Sooner  If  Any  Problems

## 2017-03-22 ENCOUNTER — Encounter (HOSPITAL_COMMUNITY): Payer: Self-pay | Admitting: *Deleted

## 2017-03-22 ENCOUNTER — Ambulatory Visit (HOSPITAL_COMMUNITY)
Admission: EM | Admit: 2017-03-22 | Discharge: 2017-03-22 | Disposition: A | Discharge status: Home / Self Care | Payer: Managed Care, Other (non HMO) | Attending: Family Medicine | Admitting: Family Medicine

## 2017-03-22 DIAGNOSIS — Z23 Encounter for immunization: Secondary | ICD-10-CM | POA: Diagnosis not present

## 2017-03-22 DIAGNOSIS — Z203 Contact with and (suspected) exposure to rabies: Secondary | ICD-10-CM | POA: Diagnosis not present

## 2017-03-22 MED ORDER — RABIES VACCINE, PCEC IM SUSR
INTRAMUSCULAR | Status: AC
  Filled 2017-03-22: qty 1

## 2017-03-22 MED ORDER — RABIES VACCINE, PCEC IM SUSR
1.0000 mL | Freq: Once | INTRAMUSCULAR | Status: AC
  Administered 2017-03-22: 1 mL via INTRAMUSCULAR

## 2017-03-22 NOTE — ED Triage Notes (Signed)
Here  For  Rabies   Vaccine    Verbalizes    No   Complaints

## 2017-03-22 NOTE — Discharge Instructions (Signed)
followup  As  Directed  For  Your next injection    Sooner  If  Needed

## 2017-03-29 ENCOUNTER — Ambulatory Visit (HOSPITAL_COMMUNITY)
Admission: EM | Admit: 2017-03-29 | Discharge: 2017-03-29 | Disposition: A | Discharge status: Home / Self Care | Payer: Managed Care, Other (non HMO) | Attending: Internal Medicine | Admitting: Internal Medicine

## 2017-03-29 ENCOUNTER — Encounter (HOSPITAL_COMMUNITY): Payer: Self-pay | Admitting: Emergency Medicine

## 2017-03-29 DIAGNOSIS — Z203 Contact with and (suspected) exposure to rabies: Secondary | ICD-10-CM | POA: Diagnosis not present

## 2017-03-29 DIAGNOSIS — Z23 Encounter for immunization: Secondary | ICD-10-CM

## 2017-03-29 MED ORDER — RABIES VACCINE, PCEC IM SUSR
1.0000 mL | Freq: Once | INTRAMUSCULAR | Status: AC
  Administered 2017-03-29: 1 mL via INTRAMUSCULAR

## 2017-03-29 MED ORDER — RABIES VACCINE, PCEC IM SUSR
INTRAMUSCULAR | Status: AC
  Filled 2017-03-29: qty 1

## 2017-03-29 NOTE — ED Triage Notes (Signed)
Animal exposure 8/6 was initial visit.  Today patient is here for last in rabies series

## 2017-07-23 ENCOUNTER — Ambulatory Visit: Payer: Managed Care, Other (non HMO) | Admitting: Family Medicine

## 2017-07-23 ENCOUNTER — Encounter: Payer: Self-pay | Admitting: Family Medicine

## 2017-07-23 VITALS — BP 120/80 | HR 54 | Temp 98.2°F | Wt 146.3 lb

## 2017-07-23 DIAGNOSIS — R0789 Other chest pain: Secondary | ICD-10-CM

## 2017-07-23 DIAGNOSIS — Z23 Encounter for immunization: Secondary | ICD-10-CM

## 2017-07-23 DIAGNOSIS — M25521 Pain in right elbow: Secondary | ICD-10-CM | POA: Diagnosis not present

## 2017-07-23 DIAGNOSIS — M25531 Pain in right wrist: Secondary | ICD-10-CM | POA: Diagnosis not present

## 2017-07-23 NOTE — Patient Instructions (Addendum)
If your wrist pain continues or gets worse please let the clinic know.   Wrist Pain, Adult There are many things that can cause wrist pain. Some common causes include:  An injury to the wrist area, such as a sprain, strain, or fracture.  Overuse of the joint.  A condition that causes increased pressure on a nerve in the wrist (carpal tunnel syndrome).  Wear and tear of the joints that occurs with aging (osteoarthritis).  A variety of other types of arthritis.  Sometimes, the cause of wrist pain is not known. Often, the pain goes away when you follow instructions from your health care provider for relieving pain at home, such as resting or icing the wrist. If your wrist pain continues, it is important to tell your health care provider. Follow these instructions at home:  Rest the wrist area for at least 48 hours or as long as told by your health care provider.  If a splint or elastic bandage has been applied, use it as told by your health care provider. ? Remove the splint or bandage only as told by your health care provider. ? Loosen the splint or bandage if your fingers tingle, become numb, or turn cold or blue.  If directed, apply ice to the injured area. ? If you have a removable splint or elastic bandage, remove it as told by your health care provider. ? Put ice in a plastic bag. ? Place a towel between your skin and the bag or between your splint or bandage and the bag. ? Leave the ice on for 20 minutes, 2-3 times a day.  Keep your arm raised (elevated) above the level of your heart while you are sitting or lying down.  Take over-the-counter and prescription medicines only as told by your health care provider.  Keep all follow-up visits as told by your health care provider. This is important. Contact a health care provider if:  You have a sudden sharp pain in the wrist, hand, or arm that is different or new.  The swelling or bruising on your wrist or hand gets  worse.  Your skin becomes red, gets a rash, or has open sores.  Your pain does not get better or it gets worse. Get help right away if:  You lose feeling in your fingers or hand.  Your fingers turn white, very red, or cold and blue.  You cannot move your fingers.  You have a fever or chills. This information is not intended to replace advice given to you by your health care provider. Make sure you discuss any questions you have with your health care provider. Document Released: 05/06/2005 Document Revised: 02/20/2016 Document Reviewed: 02/13/2016 Elsevier Interactive Patient Education  2017 [employer]

## 2017-07-23 NOTE — Progress Notes (Signed)
Subjective:    Patient ID: Lauren Sutton, female    DOB: 1966-05-22, 51 y.o.   MRN: [id number]  No chief complaint on file.   HPI Patient was seen today for acute concern.  Patient states last Friday while walking in her neighborhood she twisted her ankle subsequently falling.  Patient fell with her right arm outstretched/her right breast and side.  Patient states at the time her left ankle became swollen and was painful.  It has since improved patient continues to have pain in her right wrist and elbow.  Pain is not constant, and is dull ache.  Patient states she has a high pain threshold so has not noticed it.  She has tried 400 mg of ibuprofen with some relief.  Patient also endorses right sided pain underneath her breast with coughing and laughing.  Past Medical History:  Diagnosis Date  . Abnormal Pap smear 2005  . Anxiety   . H/O insomnia 2011  . H/O migraine 2005  . H/O migraine   . IBS (irritable bowel syndrome) 2009  . Yeast infection     No Known Allergies  ROS General: Denies fever, chills, night sweats, changes in weight, changes in appetite HEENT: Denies headaches, ear pain, changes in vision, rhinorrhea, sore throat CV: Denies CP, palpitations, SOB, orthopnea Pulm: Denies SOB, cough, wheezing GI: Denies abdominal pain, nausea, vomiting, diarrhea, constipation GU: Denies dysuria, hematuria, frequency, vaginal discharge Msk: Denies muscle cramps, joint pains   +R wrist, R elbow, and R rib pain Neuro: Denies weakness, numbness, tingling Skin: Denies rashes, bruising Psych: Denies depression, anxiety, hallucinations     Objective:    Blood pressure 120/80, pulse (!) 54, temperature 98.2 F (36.8 C), temperature source Oral, weight 146 lb 4.8 oz (66.4 kg).   Gen. Pleasant, well-nourished, in no distress, normal affect  HEENT: Makena/AT, face symmetric, no scleral icterus, PERRLA, nares patent without drainage, pharynx without erythema or exudate. Lungs: no accessory  muscle use, CTAB, no wheezes or rales Cardiovascular: RRR, no m/r/g, no peripheral edema Musculoskeletal: No deformities, no cyanosis or clubbing, normal tone.  Mild point TTP of right lateral lower rib cage, no deformities.  Right arm and hand without edema or erythema.  No TTP of right wrist, negative Tinel and Phalen.  Mild TTP of medial right epicondyle.  F passive ROM of right arm at elbow, hesitant with passive range of motion of right arm and elbow but able to fully extend arm Neuro:  A&Ox3, CN II-XII intact, normal gait   Wt Readings from Last 3 Encounters:  07/23/17 146 lb 4.8 oz (66.4 kg)  03/18/17 141 lb (64 kg)  03/15/17 141 lb 3.2 oz (64 kg)    Lab Results  Component Value Date   GLUCOSE 86 06/01/2016   CHOL 190 06/01/2016   TRIG 71.0 06/01/2016   HDL 61.20 06/01/2016   LDLCALC 114 (H) 06/01/2016   NA 139 06/01/2016   K 4.3 06/01/2016   CL 104 06/01/2016   CREATININE 0.79 06/01/2016   BUN 16 06/01/2016   CO2 29 06/01/2016   HGBA1C 5.8 06/01/2016    Assessment/Plan:  Elbow pain, right  -Difficult to reproduce pain on exam, however patient states she has a high pain threshold. -Given continued pain/discomfort we will proceed with x-ray. -Orders placed, if pain continues/becomes worse patient to have x-ray done on Monday. - Plan: DG Elbow Complete Right  Acute pain of right wrist -Difficult to reproduce pain on exam, however patient states she has a high  pain threshold. -Given continued pain/discomfort we will proceed with x-ray. -Orders placed, if pain continues/becomes worse patient to have x-ray done on Monday.  - Plan: DG Wrist Complete Right  Acute chest wall pain -No ecchymosis present -Discussed supportive care. -Patient encouraged to continue taking ibuprofen as needed.  Patient advised she can take 800 mg every 8 hours as needed. -Patient encouraged to take deep breaths in every hour to prevent atelectasis. -Patient given RTC or ED precautions  including increased pain, shortness of breath, fever, chills, obvious deformities in rib cage, etc.  Need for prophylactic vaccination and inoculation against influenza  - Plan: Flu Vaccine QUAD 36+ mos IM   F/u prn for continued or worsening pain.

## 2018-01-24 ENCOUNTER — Encounter: Payer: Self-pay | Admitting: Podiatry

## 2018-01-24 ENCOUNTER — Ambulatory Visit: Payer: Managed Care, Other (non HMO) | Admitting: Podiatry

## 2018-01-24 ENCOUNTER — Other Ambulatory Visit: Payer: Self-pay | Admitting: Podiatry

## 2018-01-24 ENCOUNTER — Ambulatory Visit (INDEPENDENT_AMBULATORY_CARE_PROVIDER_SITE_OTHER): Payer: Managed Care, Other (non HMO)

## 2018-01-24 DIAGNOSIS — M722 Plantar fascial fibromatosis: Secondary | ICD-10-CM

## 2018-01-24 DIAGNOSIS — M79672 Pain in left foot: Principal | ICD-10-CM

## 2018-01-24 DIAGNOSIS — M79671 Pain in right foot: Secondary | ICD-10-CM | POA: Diagnosis not present

## 2018-01-24 MED ORDER — DICLOFENAC SODIUM 75 MG PO TBEC: 75.0000 mg | DELAYED_RELEASE_TABLET | Freq: Two times a day (BID) | ORAL | 2 refills | Status: DC

## 2018-01-24 MED ORDER — TRIAMCINOLONE ACETONIDE 10 MG/ML IJ SUSP
10.0000 mg | Freq: Once | INTRAMUSCULAR | Status: AC
  Administered 2018-01-24: 10 mg

## 2018-01-24 NOTE — Patient Instructions (Signed)

## 2018-01-26 NOTE — Progress Notes (Signed)
Subjective:   Patient ID: Lauren Sutton, female   DOB: 52 y.o.   MRN: [id number]   HPI Patient presents stating she gets a lot of pain in her mid arch is mostly at nighttime and states that she is had histories of plantar fasciitis.  States is been going on around a year and at times makes it hard to sleep and she has to sleep in certain positions to be comfortable and she is tried splints which have not been successful.  Patient does not smoke likes to be active   Review of Systems  All other systems reviewed and are negative.       Objective:  Physical Exam  Constitutional: She appears well-developed and well-nourished.  Cardiovascular: Intact distal pulses.  Pulmonary/Chest: Effort normal.  Musculoskeletal: Normal range of motion.  Neurological: She is alert.  Skin: Skin is warm.  Nursing note and vitals reviewed.   Neurovascular status found to be intact muscle strength is adequate range of motion within normal limits with patient noted to have discomfort in the mid arch area bilateral with tightness of the plantar fascia that is within this area.  Patient was noted to have good digital pre-infusion as well oriented x3 mild depression of the arch and equinus     Assessment:  Possibility that this is fasciitis inflammatory versus possibility that this could be neuropathic in its origin     Plan:  H&P conditions reviewed discussed and explained the different conditions present.  At this point we will treat as an inflammatory condition I did do careful mid arch injections bilateral 3 mg Kenalog 5 mg Xylocaine and I placed on diclofenac 75 mg twice daily.  I discussed stretching exercises and shoe gear modifications will reevaluate again we may have to consider nerve biopsies or other modalities depending on response and ultimately medication if it appears more of a neuropathic type condition  X-rays were negative for signs of spurring with minimal depression of the arch and no  indication of arthritis

## 2018-02-07 ENCOUNTER — Ambulatory Visit: Payer: Managed Care, Other (non HMO) | Admitting: Podiatry

## 2018-08-12 DIAGNOSIS — M9903 Segmental and somatic dysfunction of lumbar region: Secondary | ICD-10-CM | POA: Diagnosis not present

## 2018-08-12 DIAGNOSIS — M9905 Segmental and somatic dysfunction of pelvic region: Secondary | ICD-10-CM | POA: Diagnosis not present

## 2018-08-12 DIAGNOSIS — M9902 Segmental and somatic dysfunction of thoracic region: Secondary | ICD-10-CM | POA: Diagnosis not present

## 2018-08-12 DIAGNOSIS — M9901 Segmental and somatic dysfunction of cervical region: Secondary | ICD-10-CM | POA: Diagnosis not present

## 2018-08-19 DIAGNOSIS — M9901 Segmental and somatic dysfunction of cervical region: Secondary | ICD-10-CM | POA: Diagnosis not present

## 2018-08-19 DIAGNOSIS — M9905 Segmental and somatic dysfunction of pelvic region: Secondary | ICD-10-CM | POA: Diagnosis not present

## 2018-08-19 DIAGNOSIS — M9903 Segmental and somatic dysfunction of lumbar region: Secondary | ICD-10-CM | POA: Diagnosis not present

## 2018-08-19 DIAGNOSIS — M9902 Segmental and somatic dysfunction of thoracic region: Secondary | ICD-10-CM | POA: Diagnosis not present

## 2018-09-09 DIAGNOSIS — M9902 Segmental and somatic dysfunction of thoracic region: Secondary | ICD-10-CM | POA: Diagnosis not present

## 2018-09-09 DIAGNOSIS — M9903 Segmental and somatic dysfunction of lumbar region: Secondary | ICD-10-CM | POA: Diagnosis not present

## 2018-09-09 DIAGNOSIS — M9901 Segmental and somatic dysfunction of cervical region: Secondary | ICD-10-CM | POA: Diagnosis not present

## 2018-09-09 DIAGNOSIS — M9905 Segmental and somatic dysfunction of pelvic region: Secondary | ICD-10-CM | POA: Diagnosis not present

## 2018-09-16 DIAGNOSIS — M9901 Segmental and somatic dysfunction of cervical region: Secondary | ICD-10-CM | POA: Diagnosis not present

## 2018-09-16 DIAGNOSIS — M9902 Segmental and somatic dysfunction of thoracic region: Secondary | ICD-10-CM | POA: Diagnosis not present

## 2018-09-16 DIAGNOSIS — M9905 Segmental and somatic dysfunction of pelvic region: Secondary | ICD-10-CM | POA: Diagnosis not present

## 2018-09-16 DIAGNOSIS — M9903 Segmental and somatic dysfunction of lumbar region: Secondary | ICD-10-CM | POA: Diagnosis not present

## 2018-09-30 DIAGNOSIS — M9905 Segmental and somatic dysfunction of pelvic region: Secondary | ICD-10-CM | POA: Diagnosis not present

## 2018-09-30 DIAGNOSIS — M9901 Segmental and somatic dysfunction of cervical region: Secondary | ICD-10-CM | POA: Diagnosis not present

## 2018-09-30 DIAGNOSIS — M9903 Segmental and somatic dysfunction of lumbar region: Secondary | ICD-10-CM | POA: Diagnosis not present

## 2018-09-30 DIAGNOSIS — M9902 Segmental and somatic dysfunction of thoracic region: Secondary | ICD-10-CM | POA: Diagnosis not present

## 2018-10-05 DIAGNOSIS — R7989 Other specified abnormal findings of blood chemistry: Secondary | ICD-10-CM | POA: Diagnosis not present

## 2018-10-05 DIAGNOSIS — R202 Paresthesia of skin: Secondary | ICD-10-CM | POA: Diagnosis not present

## 2018-10-05 DIAGNOSIS — R7309 Other abnormal glucose: Secondary | ICD-10-CM | POA: Diagnosis not present

## 2018-10-05 DIAGNOSIS — M79672 Pain in left foot: Secondary | ICD-10-CM | POA: Diagnosis not present

## 2018-10-05 DIAGNOSIS — M79671 Pain in right foot: Secondary | ICD-10-CM | POA: Diagnosis not present

## 2018-10-06 DIAGNOSIS — Z0000 Encounter for general adult medical examination without abnormal findings: Secondary | ICD-10-CM | POA: Diagnosis not present

## 2018-10-06 DIAGNOSIS — R7309 Other abnormal glucose: Secondary | ICD-10-CM | POA: Diagnosis not present

## 2018-10-06 DIAGNOSIS — E559 Vitamin D deficiency, unspecified: Secondary | ICD-10-CM | POA: Diagnosis not present

## 2018-10-06 DIAGNOSIS — R202 Paresthesia of skin: Secondary | ICD-10-CM | POA: Diagnosis not present

## 2018-10-06 DIAGNOSIS — R7989 Other specified abnormal findings of blood chemistry: Secondary | ICD-10-CM | POA: Diagnosis not present

## 2018-10-10 DIAGNOSIS — M9902 Segmental and somatic dysfunction of thoracic region: Secondary | ICD-10-CM | POA: Diagnosis not present

## 2018-10-10 DIAGNOSIS — M9905 Segmental and somatic dysfunction of pelvic region: Secondary | ICD-10-CM | POA: Diagnosis not present

## 2018-10-10 DIAGNOSIS — M9901 Segmental and somatic dysfunction of cervical region: Secondary | ICD-10-CM | POA: Diagnosis not present

## 2018-10-10 DIAGNOSIS — M9903 Segmental and somatic dysfunction of lumbar region: Secondary | ICD-10-CM | POA: Diagnosis not present

## 2018-10-11 DIAGNOSIS — Z0000 Encounter for general adult medical examination without abnormal findings: Secondary | ICD-10-CM | POA: Diagnosis not present

## 2018-10-11 DIAGNOSIS — R202 Paresthesia of skin: Secondary | ICD-10-CM | POA: Diagnosis not present

## 2018-10-11 DIAGNOSIS — E559 Vitamin D deficiency, unspecified: Secondary | ICD-10-CM | POA: Diagnosis not present

## 2018-11-30 ENCOUNTER — Telehealth: Payer: Self-pay | Admitting: Neurology

## 2018-11-30 NOTE — Telephone Encounter (Signed)
I called and spoke with the patient regarding changing their apt to a VV due to the COVID-19.  I explained to the patient that we would file their insurance and they gave consent. I also walked through the steps of downloading the app, and starting the virtual meeting, after confirming the patient had access to a smart phone with a working camera and microphone access.   Meeting number: [phone] Password: [email or web address] Host key: [id number]

## 2018-12-01 ENCOUNTER — Encounter: Payer: Self-pay | Admitting: Neurology

## 2018-12-01 NOTE — Addendum Note (Signed)
Addended by: Bertram Savin on: 12/01/2018 03:40 PM   Modules accepted: Orders

## 2018-12-01 NOTE — Telephone Encounter (Signed)
Spoke with pt. Confirmed pt using 2 identifiers. Update pt's chart including meds, PMH, pharmacy, PCP, etc. She is aware that she will get a call about 30 minutes prior to her appt on Thurs 4/30 to check in. She reported she received the webex email. I encouraged pt to download the app this weekend if she can. She verbalized appreciation for the call.

## 2018-12-08 ENCOUNTER — Ambulatory Visit: Payer: Managed Care, Other (non HMO) | Admitting: Neurology

## 2018-12-08 NOTE — Telephone Encounter (Signed)
Dr. Lucia Gaskins unexpectedly out this afternoon. Called pt. R/s her to next Wed 5/6 @ 11:30 AM. She verbalized appreciation for the call and is aware a new meeting link will be emailed to her.

## 2018-12-14 ENCOUNTER — Other Ambulatory Visit: Payer: Self-pay

## 2018-12-14 ENCOUNTER — Ambulatory Visit (INDEPENDENT_AMBULATORY_CARE_PROVIDER_SITE_OTHER): Payer: BC Managed Care – PPO | Admitting: Neurology

## 2018-12-14 DIAGNOSIS — R202 Paresthesia of skin: Secondary | ICD-10-CM

## 2018-12-14 DIAGNOSIS — G629 Polyneuropathy, unspecified: Secondary | ICD-10-CM

## 2018-12-14 DIAGNOSIS — E519 Thiamine deficiency, unspecified: Secondary | ICD-10-CM

## 2018-12-14 DIAGNOSIS — R7303 Prediabetes: Secondary | ICD-10-CM

## 2018-12-14 DIAGNOSIS — E531 Pyridoxine deficiency: Secondary | ICD-10-CM | POA: Diagnosis not present

## 2018-12-14 DIAGNOSIS — M79671 Pain in right foot: Secondary | ICD-10-CM

## 2018-12-14 DIAGNOSIS — M79672 Pain in left foot: Secondary | ICD-10-CM

## 2018-12-14 DIAGNOSIS — E538 Deficiency of other specified B group vitamins: Secondary | ICD-10-CM | POA: Diagnosis not present

## 2018-12-14 DIAGNOSIS — R269 Unspecified abnormalities of gait and mobility: Secondary | ICD-10-CM

## 2018-12-14 NOTE — Progress Notes (Signed)
[facility]    Provider:  Dr Jaynee Eagles Requesting Provider: Janie Morning, DO Primary Care Provider:  Janie Morning, DO  CC:  Peripheral neuropathy  Virtual Visit via Video Note  I connected with patient on 12/18/18 at 11:30 AM EDT by a video enabled telemedicine application and verified that I am speaking with the correct person using two identifiers. Patient is at home and physician is in the office.   I discussed the limitations of evaluation and management by telemedicine and the availability of in person appointments. The patient expressed understanding and agreed to proceed.  Melvenia Beam, MD  HPI:  Lauren Sutton is a 53 y.o. female here as requested by Janie Morning, DO for peripheral neuropathy. PMHx of  Migraines, IBS, anxiety, insomnia, plantar fasciitis. Symptoms started with plantar fasciitis 2 years ago. She went to the ortho and having tremendous pain in the heels worse in the morning. It was right before christmas. She received a shot in the feet. She felt a lot better, did holiday shopping and felt worse. The injections helped for a while. She did exercises, she has worn a boot. It did not get better. The boot started to help her knees and other devices hurt her toes. She had to sleep on the couch with bent knees to avoid pressure on the heels and she slept on the couch. In the last 6 months her legs are hurting as well. Soreness in the legs. She went to a podiatrist and won't go back, he gave injections again, no other interventions given. She had acupuncture and chiropractor. The chiropractor was somewhat helpful. She saw Dr. Theda Sers DO and prescribed gabapentin. Burning pain in the toes. Her toes feel sore and burning. If she doesn't take gabapentin she can;t sleep, constant discomfort, tingly pain in the feet. Not in the hands. The burning was in the middle of the foot with painful heel and it has now progressed to the toes.The progression unusual for  neuropathy; started in burning was in the middle of the foot with painful heel and it has now progressed to the toes. Neuropathy usually starts in the toes. Or could ne different problem entirey in the toes. The toes do not feel the same as the heel. No Fhx of neuropathy but her mother has multiple issues with her legs so unclear - mother has nerve pain as well and can;t feel her legs. So mother does have severe neuropathy and no known risk factors daughter doesn't think she has diabetes daughter unclear. She will have back pain on and radicular symptoms that radiate to the feet. Her feet symptoms are symmetric. She has thin ankles. She has a tendency when she is walking and her ankle will twist.No other focal neurologic deficits, associated symptoms, inciting events or modifiable factors.  Reviewed notes, labs and imaging from outside physicians, which showed:  LDL 114, bmp nml, hgba1c 5.8, hepc neg (06/01/2016)  Reviewed xr of both feet which show no arch, flat feet. No fractures.  Negative for signs of spurring.  No visible arthritic changes.  Reviewed notes from podiatry.  She presented with pain in her mid arch mostly at night.  History of plantar fasciitis.  Last appointment was in June 2019 and at that time had been going on for a year.  Makes it hard to sleep.  She is tried splints.  Exam noted intact muscle strength, discomfort in the mid arch area bilateral with tightness of the plantar fascia that is within  this area.  Depression of the arch and equinus.  Possibility of this is fasciitis inflammatory versus neuropathic in its origin.  She was given injections and started on diclofenac twice daily.  Also given stretching exercises and shoe gear modifications. Review of Systems: Patient complains of symptoms per HPI as well as the following symptoms: foot pain, tingling and numbness. Pertinent negatives and positives per HPI. All others negative.   Social History   Socioeconomic History    Marital status: Married    Spouse name: Not on file   Number of children: 2   Years of education: Not on file   Highest education level: Bachelor's degree (e.g., BA, AB, BS)  Occupational History   Not on file  Social Needs   Financial resource strain: Not on file   Food insecurity:    Worry: Not on file    Inability: Not on file   Transportation needs:    Medical: Not on file    Non-medical: Not on file  Tobacco Use   Smoking status: Never Smoker   Smokeless tobacco: Never Used  Substance and Sexual Activity   Alcohol use: No   Drug use: No   Sexual activity: Yes    Birth control/protection: None  Lifestyle   Physical activity:    Days per week: Not on file    Minutes per session: Not on file   Stress: Not on file  Relationships   Social connections:    Talks on phone: Not on file    Gets together: Not on file    Attends religious service: Not on file    Active member of club or organization: Not on file    Attends meetings of clubs or organizations: Not on file    Relationship status: Not on file   Intimate partner violence:    Fear of current or ex partner: Not on file    Emotionally abused: Not on file    Physically abused: Not on file    Forced sexual activity: Not on file  Other Topics Concern   Not on file  Social History Narrative   Lives at home with husband & 2 daughters   Right handed   Caffeine: 1 cup of coffee daily    Family History  Problem Relation Age of Onset   Cancer Paternal Grandmother        Breast   Hypertension Mother    Stroke Mother    Neuropathy Neg Hx     Past Medical History:  Diagnosis Date   Abnormal Pap smear 2005   Anxiety    H/O insomnia 2011   H/O migraine 2005   H/O migraine    IBS (irritable bowel syndrome) 2009   Yeast infection     Patient Active Problem List   Diagnosis Date Noted   Abnormal Pap smear    Yeast infection    H/O migraine    IBS (irritable bowel syndrome)      Anxiety    H/O insomnia     Past Surgical History:  Procedure Laterality Date   WISDOM TOOTH EXTRACTION      Current Outpatient Medications  Medication Sig Dispense Refill   Ascorbic Acid (VITAMIN C PO) Take 1,000 mg by mouth daily.     gabapentin (NEURONTIN) 400 MG capsule 1 CAPSULE ONCE A DAY BEFORE BED ORALLY 90 DAY(S)     Probiotic Product (PROBIOTIC PO) Take by mouth.     TURMERIC PO Take by mouth.  VITAMIN D PO Take 2 drops by mouth daily.     No current facility-administered medications for this visit.     Allergies as of 12/14/2018   (No Known Allergies)    Vitals: There were no vitals taken for this visit. Last Weight:  Wt Readings from Last 1 Encounters:  12/01/18 146 lb (66.2 kg)   Last Height:   Ht Readings from Last 1 Encounters:  12/01/18 '5\' 2"'  (1.575 m)     Physical exam: Exam:    Physical exam: Exam: Gen: NAD, conversant      CV: attempted, Could not perform over Web Video. Denies palpitations or chest pain or SOB. VS: Breathing at a normal rate. Weight appears within normal limits. Not febrile. Eyes: Conjunctivae clear without exudates or hemorrhage  Neuro: Detailed Neurologic Exam  Speech:    Speech is normal; fluent and spontaneous with normal comprehension.  Cognition:    The patient is oriented to person, place, and time;     recent and remote memory intact;     language fluent;     normal attention, concentration,     fund of knowledge Cranial Nerves:    The pupils are equal, round, and reactive to light. Attempted, Cannot perform fundoscopic exam. Visual fields are full to finger confrontation. Extraocular movements are intact.  The face is symmetric with normal sensation. The palate elevates in the midline. Hearing intact. Voice is normal. Shoulder shrug is normal. The tongue has normal motion without fasciculations.   Coordination:    Normal finger to nose  Gait:    Normal native gait  Motor Observation:   no  involuntary movements noted. Tone:    Appears normal  Posture:    Posture is normal. normal erect    Strength:    Strength is anti-gravity and symmetric in the upper and lower limbs.      Sensation: intact to LT     Reflex Exam:  DTR's:    Attempted, Could not perform over Web Video   Toes: Attempted Could not perform over Web Video  Clonus:   Attempted, Could not perform over Web Video     Assessment/Plan:   PMHx of  Migraines, IBS, anxiety, insomnia, plantar fasciitis. Symptoms started with plantar fasciitis 2 years ago symptoms have progressed to numbness and tingling in the toes, continued foot pain, flat arches, also describes thin weak ankles.  The progression unusual for neuropathy; started with burning was in the middle of the foot with painful heel and it has now progressed to the toes. Neuropathy usually starts in the toes. Or could ne different problem entirey in the toes. Mother has neuropathy. She will get a better family history before we meet. Could be peripheral neuropathy, inflammatory disorder causing plantar fasciitis and neuropathy, given the symptoms of flatfeet and tapering ankles need to evaluate for Charcot-Marie-Tooth or other inherited disorders.  She will need an extensive battery of testing and EMG nerve conduction study. Consider MRI lumbar spine  - emg/ncs. I followed up with our NCS team and scheduled for 2 weeks. At that time can complete exam and patient can have following labs:  - Consider genetic testing  -  Orders Placed This Encounter  Procedures   Hemoglobin A1c   Vitamin B1   B. burgdorfi Antibody   Methylmalonic acid, serum   TSH   HIV Antibody (routine testing w rflx)   Sedimentation rate   ANA w/Reflex   Sjogren's syndrome antibods(ssa + ssb)   B12 and  Folate Panel   RPR   Rheumatoid factor   Heavy metals, blood   Vitamin B6   Multiple Myeloma Panel (SPEP&IFE w/QIG)   CBC   Comprehensive metabolic panel   CK     NCV with EMG(electromyography)         Follow Up Instructions:    I discussed the assessment and treatment plan with the patient. The patient was provided an opportunity to ask questions and all were answered. The patient agreed with the plan and demonstrated an understanding of the instructions.   The patient was advised to call back or seek an in-person evaluation if the symptoms worsen or if the condition fails to improve as anticipated.  A total of 80 minutes was spent in the care of this patient, Video face-to-face(not in person) with this patient. Over half this time was spent on counseling patient on the  1. Peripheral polyneuropathy   2. B12 deficiency   3. Vitamin B1 deficiency   4. Vitamin B6 deficiency   5. Prediabetes   6. Paresthesia   7. Pain in both feet   8. Gait difficulty    diagnosis and different diagnostic and therapeutic options, counseling and coordination of care, risks ans benefits of management, compliance, or risk factor reduction and education.    Cc: Janie Morning, DO  Sarina Ill, MD  [facility] [address] [place], [place] [address]  Phone [phone] Fax [phone]

## 2018-12-18 ENCOUNTER — Encounter: Payer: Self-pay | Admitting: Neurology

## 2018-12-29 ENCOUNTER — Other Ambulatory Visit: Payer: Self-pay

## 2018-12-29 ENCOUNTER — Ambulatory Visit (INDEPENDENT_AMBULATORY_CARE_PROVIDER_SITE_OTHER): Payer: BC Managed Care – PPO | Admitting: Neurology

## 2018-12-29 ENCOUNTER — Ambulatory Visit (INDEPENDENT_AMBULATORY_CARE_PROVIDER_SITE_OTHER): Payer: Managed Care, Other (non HMO) | Admitting: Neurology

## 2018-12-29 VITALS — Temp 97.1°F

## 2018-12-29 DIAGNOSIS — Z0289 Encounter for other administrative examinations: Secondary | ICD-10-CM

## 2018-12-29 DIAGNOSIS — R202 Paresthesia of skin: Secondary | ICD-10-CM | POA: Diagnosis not present

## 2018-12-29 DIAGNOSIS — Z131 Encounter for screening for diabetes mellitus: Secondary | ICD-10-CM | POA: Diagnosis not present

## 2018-12-29 DIAGNOSIS — E538 Deficiency of other specified B group vitamins: Secondary | ICD-10-CM

## 2018-12-29 DIAGNOSIS — E531 Pyridoxine deficiency: Secondary | ICD-10-CM

## 2018-12-29 DIAGNOSIS — R208 Other disturbances of skin sensation: Secondary | ICD-10-CM | POA: Diagnosis not present

## 2018-12-29 DIAGNOSIS — M79671 Pain in right foot: Secondary | ICD-10-CM

## 2018-12-29 DIAGNOSIS — R799 Abnormal finding of blood chemistry, unspecified: Secondary | ICD-10-CM | POA: Diagnosis not present

## 2018-12-29 DIAGNOSIS — G5793 Unspecified mononeuropathy of bilateral lower limbs: Secondary | ICD-10-CM

## 2018-12-29 DIAGNOSIS — R269 Unspecified abnormalities of gait and mobility: Secondary | ICD-10-CM

## 2018-12-29 DIAGNOSIS — G629 Polyneuropathy, unspecified: Secondary | ICD-10-CM | POA: Diagnosis not present

## 2018-12-29 DIAGNOSIS — R7303 Prediabetes: Secondary | ICD-10-CM

## 2018-12-29 DIAGNOSIS — M79672 Pain in left foot: Secondary | ICD-10-CM

## 2018-12-29 DIAGNOSIS — E519 Thiamine deficiency, unspecified: Secondary | ICD-10-CM

## 2018-12-29 NOTE — Patient Instructions (Signed)

## 2018-12-30 NOTE — Progress Notes (Signed)
        Full Name: Lauren Sutton Gender: Female MRN #: [id number] Date of Birth: 06/18/1966    Visit Date: 12/29/2018 11:32 Age: 53 Years 4 Months Old Examining Physician: Antonia Ahern, MD  Referring Physician: Antonia Ahern, MD    History: Patient with sensory changes in the distal feet.  Summery: EMG/NCS was performed on a lower extremity.  All nerves and muscles (as indicated in the following tables) were within normal limits.     Conclusion: This is a normal study. This test cannot evaluate for small-fiber neuropathy which may be consistent with patient's clinical symptoms. Clinical correlation recommended.  Antonia Ahern M.D.  [facility] [address] [place], [place] [address] Tel: [phone] Fax: [phone]        MNC    Nerve / Sites Muscle Latency Ref. Amplitude Ref. Rel Amp Segments Distance Velocity Ref. Area    ms ms mV mV %  cm m/s m/s mVms  R Peroneal - EDB     Ankle EDB 4.9 ?6.5 5.6 ?2.0 100 Ankle - EDB 9   20.7     Fib head EDB 10.2  5.5  96.8 Fib head - Ankle 28 53 ?44 20.7     Pop fossa EDB 12.2  4.3  78.9 Pop fossa - Fib head 10 51 ?44 16.0         Pop fossa - Ankle      R Tibial - AH     Ankle AH 4.1 ?5.8 9.7 ?4.0 100 Ankle - AH 9   19.5     Pop fossa AH 11.1  8.7  89.6 Pop fossa - Ankle 34 48 ?41 18.7         SNC    Nerve / Sites Rec. Site Peak Lat Ref.  Amp Ref. Segments Distance    ms ms V V  cm  R Sural - Ankle (Calf)     Calf Ankle 3.5 ?4.4 19 ?6 Calf - Ankle 14  R Superficial peroneal - Ankle     Lat leg Ankle 3.3 ?4.4 12 ?6 Lat leg - Ankle 14         F  Wave    Nerve F Lat Ref.   ms ms  R Tibial - AH 44.1 ?56.0       EMG full       EMG Summary Table    Spontaneous MUAP Recruitment  Muscle IA Fib PSW Fasc Other Amp Dur. Poly Pattern  R. Vastus medialis Normal None None None _______ Normal Normal Normal Normal  R. Tibialis anterior Normal None None None _______ Normal Normal Normal Normal  R. Gastrocnemius  (Medial head) Normal None None None _______ Normal Normal Normal Normal  R. Extensor hallucis longus Normal None None None _______ Normal Normal Normal Normal  R. Abductor hallucis Normal None None None _______ Normal Normal Normal Normal      

## 2018-12-30 NOTE — Progress Notes (Signed)
See procedure note.

## 2019-01-03 ENCOUNTER — Telehealth: Payer: Self-pay | Admitting: *Deleted

## 2019-01-03 DIAGNOSIS — R208 Other disturbances of skin sensation: Secondary | ICD-10-CM | POA: Insufficient documentation

## 2019-01-03 LAB — B. BURGDORFI ANTIBODIES: Lyme IgG/IgM Ab: <0.91 {ISR} (ref 0.00–0.90)

## 2019-01-03 LAB — COMPREHENSIVE METABOLIC PANEL
ALT: 23 IU/L (ref 0–32)
AST: 24 IU/L (ref 0–40)
Albumin/Globulin Ratio: 1.9 (ref 1.2–2.2)
Albumin: 4.5 g/dL (ref 3.8–4.9)
Alkaline Phosphatase: 101 IU/L (ref 39–117)
BUN/Creatinine Ratio: 14 (ref 9–23)
BUN: 12 mg/dL (ref 6–24)
Bilirubin Total: 0.3 mg/dL (ref 0.0–1.2)
CO2: 25 mmol/L (ref 20–29)
Calcium: 9.5 mg/dL (ref 8.7–10.2)
Chloride: 102 mmol/L (ref 96–106)
Creatinine, Ser: 0.85 mg/dL (ref 0.57–1.00)
GFR calc Af Amer: 89 mL/min/{1.73_m2} (ref >59)
GFR calc non Af Amer: 77 mL/min/{1.73_m2} (ref >59)
Globulin, Total: 2.4 g/dL (ref 1.5–4.5)
Glucose: 68 mg/dL (ref 65–99)
Potassium: 3.9 mmol/L (ref 3.5–5.2)
Sodium: 143 mmol/L (ref 134–144)
Total Protein: 6.9 g/dL (ref 6.0–8.5)

## 2019-01-03 LAB — SEDIMENTATION RATE: Sed Rate: 21 mm/hr (ref 0–40)

## 2019-01-03 LAB — HEAVY METALS, BLOOD
Arsenic: 7 ug/L (ref 2–23)
Lead, Blood: NOT DETECTED ug/dL (ref 0–4)
Mercury: NOT DETECTED ug/L (ref 0.0–14.9)

## 2019-01-03 LAB — MULTIPLE MYELOMA PANEL, SERUM
Albumin SerPl Elph-Mcnc: 3.8 g/dL (ref 2.9–4.4)
Albumin/Glob SerPl: 1.3 (ref 0.7–1.7)
Alpha 1: 0.2 g/dL (ref 0.0–0.4)
Alpha2 Glob SerPl Elph-Mcnc: 0.7 g/dL (ref 0.4–1.0)
B-Globulin SerPl Elph-Mcnc: 1.1 g/dL (ref 0.7–1.3)
Gamma Glob SerPl Elph-Mcnc: 1.2 g/dL (ref 0.4–1.8)
Globulin, Total: 3.1 g/dL (ref 2.2–3.9)
IgA/Immunoglobulin A, Serum: 292 mg/dL (ref 87–352)
IgG (Immunoglobin G), Serum: 1136 mg/dL (ref 586–1602)
IgM (Immunoglobulin M), Srm: 195 mg/dL (ref 26–217)

## 2019-01-03 LAB — ANA W/REFLEX: Anti Nuclear Antibody (ANA): NEGATIVE

## 2019-01-03 LAB — SJOGREN'S SYNDROME ANTIBODS(SSA + SSB)
ENA SSA (RO) Ab: <0.2 AI (ref 0.0–0.9)
ENA SSB (LA) Ab: 0.2 AI (ref 0.0–0.9)

## 2019-01-03 LAB — CBC
Hematocrit: 38.4 % (ref 34.0–46.6)
Hemoglobin: 13.7 g/dL (ref 11.1–15.9)
MCH: 30.4 pg (ref 26.6–33.0)
MCHC: 35.7 g/dL (ref 31.5–35.7)
MCV: 85 fL (ref 79–97)
Platelets: 223 10*3/uL (ref 150–450)
RBC: 4.5 x10E6/uL (ref 3.77–5.28)
RDW: 12.4 % (ref 11.7–15.4)
WBC: 5 10*3/uL (ref 3.4–10.8)

## 2019-01-03 LAB — TSH: TSH: 1.85 u[IU]/mL (ref 0.450–4.500)

## 2019-01-03 LAB — B12 AND FOLATE PANEL
Folate: >20 ng/mL (ref >3.0)
Vitamin B-12: 807 pg/mL (ref 232–1245)

## 2019-01-03 LAB — RPR: RPR Ser Ql: NONREACTIVE

## 2019-01-03 LAB — VITAMIN B1: Thiamine: 140.9 nmol/L (ref 66.5–200.0)

## 2019-01-03 LAB — CK: Total CK: 104 U/L (ref 32–182)

## 2019-01-03 LAB — HEMOGLOBIN A1C
Est. average glucose Bld gHb Est-mCnc: 108 mg/dL
Hgb A1c MFr Bld: 5.4 % (ref 4.8–5.6)

## 2019-01-03 LAB — METHYLMALONIC ACID, SERUM: Methylmalonic Acid: 147 nmol/L (ref 0–378)

## 2019-01-03 LAB — HIV ANTIBODY (ROUTINE TESTING W REFLEX): HIV Screen 4th Generation wRfx: NONREACTIVE

## 2019-01-03 LAB — RHEUMATOID FACTOR: Rhuematoid fact SerPl-aCnc: <10 IU/mL (ref 0.0–13.9)

## 2019-01-03 LAB — VITAMIN B6: Vitamin B6: 37.4 ug/L — ABNORMAL HIGH (ref 2.0–32.8)

## 2019-01-03 NOTE — Procedures (Signed)
        Full Name: Lauren Sutton Gender: Female MRN #: [id number] Date of Birth: 09-08-1965    Visit Date: 12/29/2018 11:32 Age: 53 Years 4 Months Old Examining Physician: Naomie Dean, MD  Referring Physician: Naomie Dean, MD    History: Patient with sensory changes in the distal feet.  Summery: EMG/NCS was performed on a lower extremity.  All nerves and muscles (as indicated in the following tables) were within normal limits.     Conclusion: This is a normal study. This test cannot evaluate for small-fiber neuropathy which may be consistent with patient's clinical symptoms. Clinical correlation recommended.  Naomie Dean M.D.  [facility] [address] [place], [place] [address] Tel: [phone] Fax: [phone]        [facility]    Nerve / Sites Muscle Latency Ref. Amplitude Ref. Rel Amp Segments Distance Velocity Ref. Area    ms ms mV mV %  cm m/s m/s mVms  R Peroneal - EDB     Ankle EDB 4.9 ?6.5 5.6 ?2.0 100 Ankle - EDB 9   20.7     Fib head EDB 10.2  5.5  96.8 Fib head - Ankle 28 53 ?44 20.7     Pop fossa EDB 12.2  4.3  78.9 Pop fossa - Fib head 10 51 ?44 16.0         Pop fossa - Ankle      R Tibial - AH     Ankle AH 4.1 ?5.8 9.7 ?4.0 100 Ankle - AH 9   19.5     Pop fossa AH 11.1  8.7  89.6 Pop fossa - Ankle 34 48 ?41 18.7         SNC    Nerve / Sites Rec. Site Peak Lat Ref.  Amp Ref. Segments Distance    ms ms V V  cm  R Sural - Ankle (Calf)     Calf Ankle 3.5 ?4.4 19 ?6 Calf - Ankle 14  R Superficial peroneal - Ankle     Lat leg Ankle 3.3 ?4.4 12 ?6 Lat leg - Ankle 14         F  Wave    Nerve F Lat Ref.   ms ms  R Tibial - AH 44.1 ?56.0       EMG full       EMG Summary Table    Spontaneous MUAP Recruitment  Muscle IA Fib PSW Fasc Other Amp Dur. Poly Pattern  R. Vastus medialis Normal None None None _______ Normal Normal Normal Normal  R. Tibialis anterior Normal None None None _______ Normal Normal Normal Normal  R. Gastrocnemius  (Medial head) Normal None None None _______ Normal Normal Normal Normal  R. Extensor hallucis longus Normal None None None _______ Normal Normal Normal Normal  R. Abductor hallucis Normal None None None _______ Normal Normal Normal Normal

## 2019-01-03 NOTE — Telephone Encounter (Signed)
Spoke with pt and advised her of normal labs so far. One pending, and we will call if that is abnormal. Other labs look very good no reason for neuropathy seen. Pt verbalized understanding and appreciation.

## 2019-01-03 NOTE — Progress Notes (Signed)
History: This is a 53 year old patient here for evaluation of peripheral neuropathy bilaterally in the lower extremities distally.  Symptoms started 2 years prior in the setting of plantar fasciitis.  Injections helped for a while.  In the last 6 months her legs are hurting as well as soreness in the legs.  Burning pain in the toes, her toes feels sore, tingly pain in the heels.  I reviewed results of EMG nerve conduction study which was normal.  This rules out large fiber neuropathy however it is possible patient could still have a small fiber neuropathy.  I discussed the difference with the patient.  I also discussed that the progression was unusual for peripheral distal polyneuropathy he has the burning started in the heel and progressed to the toes however cannot be sure.  I have ordered extensive lab testing including hemoglobin A1c, B1, Lyme, methylmalonic acid, TSH, HIV, sed rate, ANA with reflex, Sjogren's, B12, folate, RPR, rheumatoid factor, heavy metals, vitamin B6, multiple myeloma panel, CBC, CMP, CK and so far labs are unremarkable.  I advised if she is having burning that she could possibly try using some Lotrimin or other fungal to see if that helps with the symptoms in the toes and follow-up with podiatry.  However at this point she could have an idiopathic small fiber neuropathy.  On exam sensory appears intact distally to all modalities and reflexes are normal in the ankles.  I reviewed all this with patient.  Also discussed we could follow clinically, genetic testing is a possibility since mother had neuropathy as well.  She will follow-up as needed in the clinic.   A total of 25 minutes was spent face-to-face with this patient. Over half this time was spent on counseling patient on the  1. Burning sensation of toe and foot   2. Peripheral polyneuropathy   3. Paresthesia   4. Pain in both feet   5. Gait difficulty   6. Vitamin B6 deficiency   7. B12 deficiency   8. Vitamin B1  deficiency   9. Prediabetes   10. Small fiber neuropathy    diagnosis and different diagnostic and therapeutic options, counseling and coordination of care, risks ans benefits of management, compliance, or risk factor reduction and education.  This does not include time spent on emg/ncs procedure.   CC:Dr. Theda Sers

## 2019-01-03 NOTE — Telephone Encounter (Signed)
-----   Message from Anson Fret, MD sent at 01/02/2019  9:58 AM EDT ----- Labs so far normal, one pending. We will call if that is abnormal but labs look very good no reason for neuropathy seen thanks

## 2019-01-23 DIAGNOSIS — W19XXXA Unspecified fall, initial encounter: Secondary | ICD-10-CM | POA: Diagnosis not present

## 2019-01-23 DIAGNOSIS — S42202A Unspecified fracture of upper end of left humerus, initial encounter for closed fracture: Secondary | ICD-10-CM | POA: Diagnosis not present

## 2019-01-23 DIAGNOSIS — M25512 Pain in left shoulder: Secondary | ICD-10-CM | POA: Diagnosis not present

## 2019-01-23 DIAGNOSIS — M25522 Pain in left elbow: Secondary | ICD-10-CM | POA: Diagnosis not present

## 2019-01-23 DIAGNOSIS — R202 Paresthesia of skin: Secondary | ICD-10-CM | POA: Diagnosis not present

## 2019-01-24 ENCOUNTER — Other Ambulatory Visit (HOSPITAL_COMMUNITY)
Admission: RE | Admit: 2019-01-24 | Discharge: 2019-01-24 | Disposition: A | Discharge status: Home / Self Care | Payer: BC Managed Care – PPO | Source: Ambulatory Visit | Attending: Orthopedic Surgery | Admitting: Orthopedic Surgery

## 2019-01-24 DIAGNOSIS — Z1159 Encounter for screening for other viral diseases: Secondary | ICD-10-CM | POA: Insufficient documentation

## 2019-01-24 NOTE — Progress Notes (Signed)
Contacted Mrs Ebbert to schedule Covid screen today at 1 pm for procedure on 6/18

## 2019-01-24 NOTE — Patient Instructions (Addendum)
Lauren BlissOlga C Edwin    Your procedure is scheduled on: 01-26-2019   [facility]  Entrance Report to admitting at 3:00 PM   YOU HAD  A COVID 19 TEST ON 01-24-2019,   ONCE YOUR COVID TEST IS COMPLETED, PLEASE BEGIN THE QUARANTINE INSTRUCTIONS AS OUTLINED IN YOUR HANDOUT.   Call this number if you have problems the morning of surgery [phone]    Remember: . BRUSH YOUR TEETH MORNING OF SURGERY AND RINSE YOUR MOUTH OUT, NO CHEWING GUM CANDY OR MINTS.   NO SOLID FOOD AFTER MIDNIGHT THE NIGHT PRIOR TO SURGERY.  NOTHING BY MOUTH EXCEPT CLEAR LIQUIDS UNTIL 10:30 AM. PLEASE FINISH ENSURE DRINK PER SURGEON ORDER WHICH NEEDS TO BE COMPLETED AT 10:30 AM.    CLEAR LIQUID DIET   Foods Allowed                                                                     Foods Excluded  Coffee and tea, regular and decaf                             liquids that you cannot  Plain Jell-O in any flavor                                             see through such as: Fruit ices (not with fruit pulp)                                     milk, soups, orange juice  Iced Popsicles                                    All solid food Carbonated beverages, regular and diet                                    Cranberry, grape and apple juices Sports drinks like Gatorade Lightly seasoned clear broth or consume(fat free) Sugar, honey syrup                      _____________________________________________________________________    Take these medicines the morning of surgery with A SIP OF WATER: HYDROCODONE IF NEEDED                                You may not have any metal on your body including hair pins and              piercings             Do not wear jewelry, make-up, lotions, powders or perfumes, deodorant             Do not wear nail polish.  Do not shave  48 hours prior to surgery.               Do not bring valuables to the hospital. [facility] IS NOT              RESPONSIBLE   FOR VALUABLES.  Contacts, dentures or bridgework may not be worn into surgery.       _____________________________________________________________________             [facility] Pre[facility] Before surgery, you can play an important role.   Because skin is not sterile, your skin needs to be as free of germs as possible.   You can reduce the number of germs on your skin by washing with CHG (chlorahexidine gluconate) soap before surgery.   CHG is an antiseptic cleaner which kills germs and bonds with the skin to continue killing germs even after washing. Please DO NOT use if you have an allergy to CHG or antibacterial soaps .  If your skin becomes reddened/irritated stop using the CHG and inform your nurse when you arrive at Short Stay. Do not shave (including legs and underarms) for at least 48 hours prior to the first CHG shower.  You may shave your face/neck. Please follow these instructions carefully:   1.  Shower with CHG Soap the night before surgery and the  morning of Surgery.  2.  If you choose to wash your hair, wash your hair first as usual with your  normal  shampoo.  3.  After you shampoo, rinse your hair and body thoroughly to remove the  shampoo.                                        4.  Use CHG as you would any other liquid soap.  You can apply chg directly  to the skin and wash                       Gently with a scrungie or clean washcloth.  5.  Apply the CHG Soap to your body ONLY FROM THE NECK DOWN.   Do not use on face/ open                           Wound or open sores. Avoid contact with eyes, ears mouth and genitals (private parts).                       Wash face,  Genitals (private parts) with your normal soap.             6.  Wash thoroughly, paying special attention to the area where your surgery  will be performed.  7.  Thoroughly rinse your body with warm water from the neck down.  8.  DO NOT shower/wash with your normal soap after  using and rinsing off  the CHG Soap.             9.  Pat yourself dry with a clean towel.            10.  Wear clean pajamas.            11.  Place clean sheets on your bed the night of your first shower and do not  sleep with pets . Day of Surgery :  Do not apply any lotions/deodorants the morning of surgery.  Please wear clean clothes to the hospital/surgery center.   FAILURE TO FOLLOW THESE INSTRUCTIONS MAY RESULT IN THE CANCELLATION OF YOUR SURGERY PATIENT SIGNATURE_________________________________  NURSE SIGNATURE__________________________________  ________________________________________________________________________   Adam Phenix  An incentive spirometer is a tool that can help keep your lungs clear and active. This tool measures how well you are filling your lungs with each breath. Taking long deep breaths may help reverse or decrease the chance of developing breathing (pulmonary) problems (especially infection) following:  A long period of time when you are unable to move or be active. BEFORE THE PROCEDURE   If the spirometer includes an indicator to show your best effort, your nurse or respiratory therapist will set it to a desired goal.  If possible, sit up straight or lean slightly forward. Try not to slouch.  Hold the incentive spirometer in an upright position. INSTRUCTIONS FOR USE  1. Sit on the edge of your bed if possible, or sit up as far as you can in bed or on a chair. 2. Hold the incentive spirometer in an upright position. 3. Breathe out normally. 4. Place the mouthpiece in your mouth and seal your lips tightly around it. 5. Breathe in slowly and as deeply as possible, raising the piston or the ball toward the top of the column. 6. Hold your breath for 3-5 seconds or for as long as possible. Allow the piston or ball to fall to the bottom of the column. 7. Remove the mouthpiece from your mouth and breathe out normally. 8. Rest for a few seconds and  repeat Steps 1 through 7 at least 10 times every 1-2 hours when you are awake. Take your time and take a few normal breaths between deep breaths. 9. The spirometer may include an indicator to show your best effort. Use the indicator as a goal to work toward during each repetition. 10. After each set of 10 deep breaths, practice coughing to be sure your lungs are clear. If you have an incision (the cut made at the time of surgery), support your incision when coughing by placing a pillow or rolled up towels firmly against it. Once you are able to get out of bed, walk around indoors and cough well. You may stop using the incentive spirometer when instructed by your caregiver.  RISKS AND COMPLICATIONS  Take your time so you do not get dizzy or light-headed.  If you are in pain, you may need to take or ask for pain medication before doing incentive spirometry. It is harder to take a deep breath if you are having pain. AFTER USE  Rest and breathe slowly and easily.  It can be helpful to keep track of a log of your progress. Your caregiver can provide you with a simple table to help with this. If you are using the spirometer at home, follow these instructions: [place] IF:   You are having difficultly using the spirometer.  You have trouble using the spirometer as often as instructed.  Your pain medication is not giving enough relief while using the spirometer.  You develop fever of 100.5 F (38.1 C) or higher. SEEK IMMEDIATE MEDICAL CARE IF:   You cough up bloody sputum that had not been present before.  You develop fever of 102 F (38.9 C) or greater.  You develop worsening pain at or near the incision site. MAKE SURE YOU:   Understand these instructions.  Will watch your  condition.  Will get help right away if you are not doing well or get worse. Document Released: 12/07/2006 Document Revised: 10/19/2011 Document Reviewed: 02/07/2007 [facility] Patient Information 2014  [place].   ________________________________________________________________________

## 2019-01-25 ENCOUNTER — Other Ambulatory Visit: Payer: Self-pay

## 2019-01-25 ENCOUNTER — Encounter (HOSPITAL_COMMUNITY): Payer: Self-pay

## 2019-01-25 ENCOUNTER — Encounter (HOSPITAL_COMMUNITY)
Admission: RE | Admit: 2019-01-25 | Discharge: 2019-01-25 | Disposition: A | Discharge status: Home / Self Care | Payer: BC Managed Care – PPO | Source: Ambulatory Visit | Attending: Orthopedic Surgery | Admitting: Orthopedic Surgery

## 2019-01-25 DIAGNOSIS — X58XXXA Exposure to other specified factors, initial encounter: Secondary | ICD-10-CM | POA: Insufficient documentation

## 2019-01-25 DIAGNOSIS — S42202A Unspecified fracture of upper end of left humerus, initial encounter for closed fracture: Secondary | ICD-10-CM | POA: Diagnosis not present

## 2019-01-25 DIAGNOSIS — Z01812 Encounter for preprocedural laboratory examination: Secondary | ICD-10-CM | POA: Diagnosis not present

## 2019-01-25 HISTORY — DX: Other specified postprocedural states: R11.2

## 2019-01-25 HISTORY — DX: Other specified postprocedural states: Z98.890

## 2019-01-25 HISTORY — DX: Other complications of anesthesia, initial encounter: T88.59XA

## 2019-01-25 HISTORY — DX: Myoneural disorder, unspecified: G70.9

## 2019-01-25 LAB — CBC
HCT: 37.4 % (ref 36.0–46.0)
Hemoglobin: 12 g/dL (ref 12.0–15.0)
MCH: 29.7 pg (ref 26.0–34.0)
MCHC: 32.1 g/dL (ref 30.0–36.0)
MCV: 92.6 fL (ref 80.0–100.0)
Platelets: 190 10*3/uL (ref 150–400)
RBC: 4.04 MIL/uL (ref 3.87–5.11)
RDW: 12.3 % (ref 11.5–15.5)
WBC: 6.7 10*3/uL (ref 4.0–10.5)
nRBC: 0 % (ref 0.0–0.2)

## 2019-01-25 LAB — NOVEL CORONAVIRUS, NAA (HOSP ORDER, SEND-OUT TO REF LAB; TAT 18-24 HRS): SARS-CoV-2, NAA: NOT DETECTED

## 2019-01-25 NOTE — Progress Notes (Signed)
SPOKE W/  _ Lauren Sutton    SCREENING SYMPTOMS OF COVID 19:   COUGH--no  RUNNY NOSE--- no  SORE THROAT---no  NASAL CONGESTION----no  SNEEZING----no  SHORTNESS OF BREATH---no  DIFFICULTY BREATHING---no  TEMP >100.0 -----no  UNEXPLAINED BODY ACHES------no  CHILLS -------- no  HEADACHES ---------no  LOSS OF SMELL/ TASTE --------no    HAVE YOU OR ANY FAMILY MEMBER TRAVELLED PAST 14 DAYS OUT OF THE   COUNTY---no STATE----no COUNTRY----no  HAVE YOU OR ANY FAMILY MEMBER BEEN EXPOSED TO ANYONE WITH COVID 19? no

## 2019-01-26 ENCOUNTER — Ambulatory Visit (HOSPITAL_COMMUNITY): Payer: BC Managed Care – PPO

## 2019-01-26 ENCOUNTER — Encounter (HOSPITAL_COMMUNITY): Payer: Self-pay | Admitting: Anesthesiology

## 2019-01-26 ENCOUNTER — Observation Stay (HOSPITAL_COMMUNITY)
Admission: RE | Admit: 2019-01-26 | Discharge: 2019-01-27 | Disposition: A | Discharge status: Home / Self Care | Payer: BC Managed Care – PPO | Attending: Orthopedic Surgery | Admitting: Orthopedic Surgery

## 2019-01-26 ENCOUNTER — Ambulatory Visit (HOSPITAL_COMMUNITY): Payer: BC Managed Care – PPO | Admitting: Physician Assistant

## 2019-01-26 ENCOUNTER — Encounter (HOSPITAL_COMMUNITY)
Admission: RE | Disposition: A | Discharge status: Home / Self Care | Payer: Self-pay | Source: Home / Self Care | Attending: Orthopedic Surgery

## 2019-01-26 ENCOUNTER — Telehealth (HOSPITAL_COMMUNITY): Payer: Self-pay | Admitting: *Deleted

## 2019-01-26 ENCOUNTER — Other Ambulatory Visit: Payer: Self-pay

## 2019-01-26 ENCOUNTER — Ambulatory Visit (HOSPITAL_COMMUNITY): Payer: BC Managed Care – PPO | Admitting: Anesthesiology

## 2019-01-26 DIAGNOSIS — Z419 Encounter for procedure for purposes other than remedying health state, unspecified: Secondary | ICD-10-CM

## 2019-01-26 DIAGNOSIS — S42209A Unspecified fracture of upper end of unspecified humerus, initial encounter for closed fracture: Secondary | ICD-10-CM | POA: Diagnosis present

## 2019-01-26 DIAGNOSIS — K589 Irritable bowel syndrome without diarrhea: Secondary | ICD-10-CM | POA: Diagnosis not present

## 2019-01-26 DIAGNOSIS — I9789 Other postprocedural complications and disorders of the circulatory system, not elsewhere classified: Secondary | ICD-10-CM | POA: Diagnosis not present

## 2019-01-26 DIAGNOSIS — W1830XA Fall on same level, unspecified, initial encounter: Secondary | ICD-10-CM | POA: Insufficient documentation

## 2019-01-26 DIAGNOSIS — F419 Anxiety disorder, unspecified: Secondary | ICD-10-CM | POA: Diagnosis not present

## 2019-01-26 DIAGNOSIS — Z4889 Encounter for other specified surgical aftercare: Secondary | ICD-10-CM | POA: Diagnosis not present

## 2019-01-26 DIAGNOSIS — M79622 Pain in left upper arm: Secondary | ICD-10-CM | POA: Diagnosis not present

## 2019-01-26 DIAGNOSIS — S42202A Unspecified fracture of upper end of left humerus, initial encounter for closed fracture: Principal | ICD-10-CM | POA: Insufficient documentation

## 2019-01-26 DIAGNOSIS — S42292A Other displaced fracture of upper end of left humerus, initial encounter for closed fracture: Secondary | ICD-10-CM | POA: Diagnosis not present

## 2019-01-26 DIAGNOSIS — G8918 Other acute postprocedural pain: Secondary | ICD-10-CM | POA: Diagnosis not present

## 2019-01-26 HISTORY — PX: ORIF HUMERUS FRACTURE: SHX2126

## 2019-01-26 SURGERY — OPEN REDUCTION INTERNAL FIXATION (ORIF) PROXIMAL HUMERUS FRACTURE
Anesthesia: General | Laterality: Left

## 2019-01-26 MED ORDER — ROCURONIUM BROMIDE 10 MG/ML (PF) SYRINGE
PREFILLED_SYRINGE | INTRAVENOUS | Status: AC
  Filled 2019-01-26: qty 10

## 2019-01-26 MED ORDER — METOCLOPRAMIDE HCL 5 MG/ML IJ SOLN: 5.0000 mg | Freq: Three times a day (TID) | INTRAMUSCULAR | Status: DC | PRN

## 2019-01-26 MED ORDER — ONDANSETRON HCL 4 MG/2ML IJ SOLN
INTRAMUSCULAR | Status: DC | PRN
  Administered 2019-01-26: 4 mg via INTRAVENOUS

## 2019-01-26 MED ORDER — ACETAMINOPHEN 325 MG PO TABS: 325.0000 mg | ORAL_TABLET | Freq: Four times a day (QID) | ORAL | Status: DC | PRN

## 2019-01-26 MED ORDER — ACETAMINOPHEN 160 MG/5ML PO SOLN: 325.0000 mg | Freq: Once | ORAL | Status: DC | PRN

## 2019-01-26 MED ORDER — LIDOCAINE 2% (20 MG/ML) 5 ML SYRINGE
INTRAMUSCULAR | Status: AC
  Filled 2019-01-26: qty 5

## 2019-01-26 MED ORDER — PHENYLEPHRINE 40 MCG/ML (10ML) SYRINGE FOR IV PUSH (FOR BLOOD PRESSURE SUPPORT)
PREFILLED_SYRINGE | INTRAVENOUS | Status: DC | PRN
  Administered 2019-01-26 (×3): 40 ug via INTRAVENOUS

## 2019-01-26 MED ORDER — DOCUSATE SODIUM 100 MG PO CAPS
100.0000 mg | ORAL_CAPSULE | Freq: Two times a day (BID) | ORAL | Status: DC
  Administered 2019-01-26 – 2019-01-27 (×2): 100 mg via ORAL
  Filled 2019-01-26 (×2): qty 1

## 2019-01-26 MED ORDER — GABAPENTIN 400 MG PO CAPS
800.0000 mg | ORAL_CAPSULE | Freq: Every day | ORAL | Status: DC
  Administered 2019-01-26: 800 mg via ORAL
  Filled 2019-01-26: qty 2

## 2019-01-26 MED ORDER — TRAMADOL HCL 50 MG PO TABS: 50.0000 mg | ORAL_TABLET | Freq: Four times a day (QID) | ORAL | Status: DC | PRN

## 2019-01-26 MED ORDER — PROMETHAZINE HCL 25 MG/ML IJ SOLN
INTRAMUSCULAR | Status: AC
  Administered 2019-01-26: 6.25 mg via INTRAVENOUS
  Filled 2019-01-26: qty 1

## 2019-01-26 MED ORDER — ONDANSETRON HCL 4 MG PO TABS: 4.0000 mg | ORAL_TABLET | Freq: Four times a day (QID) | ORAL | Status: DC | PRN

## 2019-01-26 MED ORDER — MEPERIDINE HCL 50 MG/ML IJ SOLN: 6.2500 mg | INTRAMUSCULAR | Status: DC | PRN

## 2019-01-26 MED ORDER — SUGAMMADEX SODIUM 200 MG/2ML IV SOLN
INTRAVENOUS | Status: AC
  Filled 2019-01-26: qty 2

## 2019-01-26 MED ORDER — BUPIVACAINE LIPOSOME 1.3 % IJ SUSP
INTRAMUSCULAR | Status: DC | PRN
  Administered 2019-01-26: 10 mL via PERINEURAL

## 2019-01-26 MED ORDER — LACTATED RINGERS IV SOLN
INTRAVENOUS | Status: DC
  Administered 2019-01-26 (×2): via INTRAVENOUS

## 2019-01-26 MED ORDER — POLYETHYLENE GLYCOL 3350 17 G PO PACK: 17.0000 g | PACK | Freq: Every day | ORAL | Status: DC | PRN

## 2019-01-26 MED ORDER — SUGAMMADEX SODIUM 200 MG/2ML IV SOLN
INTRAVENOUS | Status: DC | PRN
  Administered 2019-01-26: 150 mg via INTRAVENOUS

## 2019-01-26 MED ORDER — 0.9 % SODIUM CHLORIDE (POUR BTL) OPTIME
TOPICAL | Status: DC | PRN
  Administered 2019-01-26: 1000 mL

## 2019-01-26 MED ORDER — SODIUM CHLORIDE 0.9 % IV SOLN
INTRAVENOUS | Status: DC | PRN
  Administered 2019-01-26: 25 ug/min via INTRAVENOUS

## 2019-01-26 MED ORDER — CHLORHEXIDINE GLUCONATE 4 % EX LIQD
60.0000 mL | Freq: Once | CUTANEOUS | Status: AC
  Administered 2019-01-26: 4 via TOPICAL

## 2019-01-26 MED ORDER — TRANEXAMIC ACID-NACL 1000-0.7 MG/100ML-% IV SOLN
1000.0000 mg | INTRAVENOUS | Status: AC
  Administered 2019-01-26: 1000 mg via INTRAVENOUS
  Filled 2019-01-26: qty 100

## 2019-01-26 MED ORDER — MENTHOL 3 MG MT LOZG: 1.0000 | LOZENGE | OROMUCOSAL | Status: DC | PRN

## 2019-01-26 MED ORDER — HYDROMORPHONE HCL 1 MG/ML IJ SOLN: 0.5000 mg | INTRAMUSCULAR | Status: DC | PRN

## 2019-01-26 MED ORDER — FENTANYL CITRATE (PF) 100 MCG/2ML IJ SOLN
50.0000 ug | INTRAMUSCULAR | Status: DC
  Administered 2019-01-26: 50 ug via INTRAVENOUS
  Filled 2019-01-26: qty 2

## 2019-01-26 MED ORDER — SCOPOLAMINE 1 MG/3DAYS TD PT72
MEDICATED_PATCH | TRANSDERMAL | Status: AC
  Filled 2019-01-26: qty 1

## 2019-01-26 MED ORDER — ACETAMINOPHEN 325 MG PO TABS: 325.0000 mg | ORAL_TABLET | Freq: Once | ORAL | Status: DC | PRN

## 2019-01-26 MED ORDER — DEXAMETHASONE SODIUM PHOSPHATE 10 MG/ML IJ SOLN
INTRAMUSCULAR | Status: AC
  Filled 2019-01-26: qty 1

## 2019-01-26 MED ORDER — ONDANSETRON HCL 4 MG/2ML IJ SOLN: 4.0000 mg | Freq: Four times a day (QID) | INTRAMUSCULAR | Status: DC | PRN

## 2019-01-26 MED ORDER — HYDROMORPHONE HCL 1 MG/ML IJ SOLN: 0.2500 mg | INTRAMUSCULAR | Status: DC | PRN

## 2019-01-26 MED ORDER — CEFAZOLIN SODIUM-DEXTROSE 2-4 GM/100ML-% IV SOLN
2.0000 g | INTRAVENOUS | Status: AC
  Administered 2019-01-26: 2 g via INTRAVENOUS
  Filled 2019-01-26: qty 100

## 2019-01-26 MED ORDER — ROCURONIUM BROMIDE 10 MG/ML (PF) SYRINGE
PREFILLED_SYRINGE | INTRAVENOUS | Status: DC | PRN
  Administered 2019-01-26: 40 mg via INTRAVENOUS
  Administered 2019-01-26 (×2): 10 mg via INTRAVENOUS

## 2019-01-26 MED ORDER — METHOCARBAMOL 500 MG PO TABS: 500.0000 mg | ORAL_TABLET | Freq: Four times a day (QID) | ORAL | Status: DC | PRN

## 2019-01-26 MED ORDER — ACETAMINOPHEN 10 MG/ML IV SOLN: 1000.0000 mg | Freq: Once | INTRAVENOUS | Status: DC | PRN

## 2019-01-26 MED ORDER — DIPHENHYDRAMINE HCL 12.5 MG/5ML PO ELIX: 12.5000 mg | ORAL_SOLUTION | ORAL | Status: DC | PRN

## 2019-01-26 MED ORDER — PROMETHAZINE HCL 25 MG/ML IJ SOLN
6.2500 mg | INTRAMUSCULAR | Status: DC | PRN
  Administered 2019-01-26: 6.25 mg via INTRAVENOUS

## 2019-01-26 MED ORDER — LACTATED RINGERS IV SOLN
INTRAVENOUS | Status: DC
  Administered 2019-01-26: 21:00:00 via INTRAVENOUS

## 2019-01-26 MED ORDER — SUCCINYLCHOLINE CHLORIDE 200 MG/10ML IV SOSY
PREFILLED_SYRINGE | INTRAVENOUS | Status: AC
  Filled 2019-01-26: qty 10

## 2019-01-26 MED ORDER — CEFAZOLIN SODIUM-DEXTROSE 2-4 GM/100ML-% IV SOLN: 2.0000 g | INTRAVENOUS | Status: DC

## 2019-01-26 MED ORDER — METHOCARBAMOL 500 MG IVPB - SIMPLE MED
500.0000 mg | Freq: Four times a day (QID) | INTRAVENOUS | Status: DC | PRN
  Filled 2019-01-26: qty 50

## 2019-01-26 MED ORDER — PHENOL 1.4 % MT LIQD: 1.0000 | OROMUCOSAL | Status: DC | PRN

## 2019-01-26 MED ORDER — DEXAMETHASONE SODIUM PHOSPHATE 10 MG/ML IJ SOLN
INTRAMUSCULAR | Status: DC | PRN
  Administered 2019-01-26: 10 mg via INTRAVENOUS

## 2019-01-26 MED ORDER — FENTANYL CITRATE (PF) 250 MCG/5ML IJ SOLN
INTRAMUSCULAR | Status: DC | PRN
  Administered 2019-01-26 (×2): 100 ug via INTRAVENOUS

## 2019-01-26 MED ORDER — FENTANYL CITRATE (PF) 100 MCG/2ML IJ SOLN
INTRAMUSCULAR | Status: AC
  Filled 2019-01-26: qty 2

## 2019-01-26 MED ORDER — OXYCODONE HCL 5 MG PO TABS: 5.0000 mg | ORAL_TABLET | ORAL | Status: DC | PRN

## 2019-01-26 MED ORDER — LIDOCAINE 2% (20 MG/ML) 5 ML SYRINGE
INTRAMUSCULAR | Status: DC | PRN
  Administered 2019-01-26: 75 mg via INTRAVENOUS

## 2019-01-26 MED ORDER — ONDANSETRON HCL 4 MG/2ML IJ SOLN
INTRAMUSCULAR | Status: AC
  Filled 2019-01-26: qty 2

## 2019-01-26 MED ORDER — OXYCODONE HCL 5 MG PO TABS: 10.0000 mg | ORAL_TABLET | ORAL | Status: DC | PRN

## 2019-01-26 MED ORDER — SUCCINYLCHOLINE CHLORIDE 200 MG/10ML IV SOSY
PREFILLED_SYRINGE | INTRAVENOUS | Status: DC | PRN
  Administered 2019-01-26: 100 mg via INTRAVENOUS

## 2019-01-26 MED ORDER — LACTATED RINGERS IV SOLN: INTRAVENOUS | Status: DC

## 2019-01-26 MED ORDER — BUPIVACAINE-EPINEPHRINE (PF) 0.5% -1:200000 IJ SOLN
INTRAMUSCULAR | Status: DC | PRN
  Administered 2019-01-26: 15 mL via PERINEURAL

## 2019-01-26 MED ORDER — BISACODYL 5 MG PO TBEC: 5.0000 mg | DELAYED_RELEASE_TABLET | Freq: Every day | ORAL | Status: DC | PRN

## 2019-01-26 MED ORDER — PROPOFOL 10 MG/ML IV BOLUS
INTRAVENOUS | Status: AC
  Filled 2019-01-26: qty 20

## 2019-01-26 MED ORDER — MAGNESIUM CITRATE PO SOLN: 1.0000 | Freq: Once | ORAL | Status: DC | PRN

## 2019-01-26 MED ORDER — MIDAZOLAM HCL 2 MG/2ML IJ SOLN
1.0000 mg | INTRAMUSCULAR | Status: DC
  Administered 2019-01-26: 2 mg via INTRAVENOUS
  Filled 2019-01-26: qty 2

## 2019-01-26 MED ORDER — SCOPOLAMINE 1 MG/3DAYS TD PT72
1.0000 | MEDICATED_PATCH | TRANSDERMAL | Status: DC
  Administered 2019-01-26: 1.5 mg via TRANSDERMAL

## 2019-01-26 MED ORDER — PROPOFOL 10 MG/ML IV BOLUS
INTRAVENOUS | Status: DC | PRN
  Administered 2019-01-26: 100 mg via INTRAVENOUS

## 2019-01-26 MED ORDER — METOCLOPRAMIDE HCL 5 MG PO TABS: 5.0000 mg | ORAL_TABLET | Freq: Three times a day (TID) | ORAL | Status: DC | PRN

## 2019-01-26 SURGICAL SUPPLY — 59 items
ADH SKN CLS APL DERMABOND .7 (GAUZE/BANDAGES/DRESSINGS) ×1
APL PRP STRL LF DISP 70% ISPRP (MISCELLANEOUS) ×1
BAG SPEC THK2 15X12 ZIP CLS (MISCELLANEOUS) ×1
BAG ZIPLOCK 12X15 (MISCELLANEOUS) ×2 IMPLANT
BIT DRILL 3.2 (BIT) ×2
BIT DRILL 3.2XCALB NS DISP (BIT) IMPLANT
BIT DRILL CALIBRATED 2.7 (BIT) ×1 IMPLANT
BIT DRL 3.2XCALB NS DISP (BIT) ×1
CHLORAPREP W/TINT 26 (MISCELLANEOUS) ×2 IMPLANT
COVER SURGICAL LIGHT HANDLE (MISCELLANEOUS) ×2 IMPLANT
COVER WAND RF STERILE (DRAPES) IMPLANT
DERMABOND ADVANCED (GAUZE/BANDAGES/DRESSINGS) ×1
DERMABOND ADVANCED .7 DNX12 (GAUZE/BANDAGES/DRESSINGS) ×1 IMPLANT
DRAPE C-ARM 42X120 X-RAY (DRAPES) ×1 IMPLANT
DRAPE C-ARMOR (DRAPES) IMPLANT
DRAPE ORTHO SPLIT 77X108 STRL (DRAPES) ×4
DRAPE SURG ORHT 6 SPLT 77X108 (DRAPES) ×2 IMPLANT
DRAPE U-SHAPE 47X51 STRL (DRAPES) ×2 IMPLANT
DRESSING AQUACEL AG SP 3.5X10 (GAUZE/BANDAGES/DRESSINGS) IMPLANT
DRSG AQUACEL AG ADV 3.5X 6 (GAUZE/BANDAGES/DRESSINGS) IMPLANT
DRSG AQUACEL AG ADV 3.5X10 (GAUZE/BANDAGES/DRESSINGS) IMPLANT
DRSG AQUACEL AG SP 3.5X10 (GAUZE/BANDAGES/DRESSINGS) ×2
ELECT REM PT RETURN 15FT ADLT (MISCELLANEOUS) ×2 IMPLANT
GLOVE BIO SURGEON STRL SZ7.5 (GLOVE) ×2 IMPLANT
GLOVE BIO SURGEON STRL SZ8 (GLOVE) ×2 IMPLANT
GLOVE SS BIOGEL STRL SZ 7 (GLOVE) ×1 IMPLANT
GLOVE SS BIOGEL STRL SZ 7.5 (GLOVE) ×1 IMPLANT
GLOVE SUPERSENSE BIOGEL SZ 7 (GLOVE) ×1
GLOVE SUPERSENSE BIOGEL SZ 7.5 (GLOVE) ×1
GOWN STRL REUS W/TWL LRG LVL3 (GOWN DISPOSABLE) ×4 IMPLANT
K-WIRE 2X5 SS THRDED S3 (WIRE) ×2
KIT BASIN OR (CUSTOM PROCEDURE TRAY) ×2 IMPLANT
KIT TURNOVER KIT A (KITS) ×1 IMPLANT
KWIRE 2X5 SS THRDED S3 (WIRE) IMPLANT
MANIFOLD NEPTUNE II (INSTRUMENTS) ×2 IMPLANT
NS IRRIG 1000ML POUR BTL (IV SOLUTION) ×2 IMPLANT
PACK SHOULDER (CUSTOM PROCEDURE TRAY) ×2 IMPLANT
PEG LOCKING 3.2X32 (Peg) ×2 IMPLANT
PEG LOCKING 3.2X34 (Screw) ×1 IMPLANT
PEG LOCKING 3.2X36 (Screw) ×2 IMPLANT
PEG LOCKING 3.2X38 (Screw) ×1 IMPLANT
PEG LOCKING 3.2X40 (Peg) IMPLANT
PEG LOCKING 3.2X48 (Peg) ×2 IMPLANT
PLATE PROX HUM HI L 3H 80 (Plate) ×1 IMPLANT
PROTECTOR NERVE ULNAR (MISCELLANEOUS) ×2 IMPLANT
PUTTY DBM STAGRAFT PLUS 5CC (Putty) ×1 IMPLANT
SCREW LP NL T15 3.5X20 (Screw) ×1 IMPLANT
SCREW LP NL T15 3.5X22 (Screw) ×2 IMPLANT
SLING ARM FOAM STRAP MED (SOFTGOODS) ×1 IMPLANT
SLING ARM IMMOBILIZER LRG (SOFTGOODS) IMPLANT
SLING ARM IMMOBILIZER MED (SOFTGOODS) IMPLANT
SLING ULTRA III MED (ORTHOPEDIC SUPPLIES) ×1 IMPLANT
SUCTION FRAZIER HANDLE 12FR (TUBING) ×1
SUCTION TUBE FRAZIER 12FR DISP (TUBING) IMPLANT
SUT MNCRL AB 3-0 PS2 18 (SUTURE) ×2 IMPLANT
SUT MON AB 2-0 CT1 36 (SUTURE) ×2 IMPLANT
SUT VIC AB 1 CT1 36 (SUTURE) ×2 IMPLANT
TOWEL OR 17X26 10 PK STRL BLUE (TOWEL DISPOSABLE) ×2 IMPLANT
TOWEL OR NON WOVEN STRL DISP B (DISPOSABLE) ×2 IMPLANT

## 2019-01-26 NOTE — Anesthesia Procedure Notes (Signed)
Procedure Name: Intubation Date/Time: 01/26/2019 4:09 PM Performed by: Lollie Sails, CRNA Pre-anesthesia Checklist: Patient identified, Emergency Drugs available, Suction available, Patient being monitored and Timeout performed Patient Re-evaluated:Patient Re-evaluated prior to induction Oxygen Delivery Method: Circle system utilized Preoxygenation: Pre-oxygenation with 100% oxygen Induction Type: IV induction and Rapid sequence Laryngoscope Size: Miller and 3 Grade View: Grade I Tube type: Oral Tube size: 7.0 mm Number of attempts: 1 Airway Equipment and Method: Stylet Placement Confirmation: ETT inserted through vocal cords under direct vision,  positive ETCO2 and breath sounds checked- equal and bilateral Secured at: 21 cm Tube secured with: Tape Dental Injury: Teeth and Oropharynx as per pre-operative assessment

## 2019-01-26 NOTE — Progress Notes (Signed)
Assisted Dr. Hollis with left, ultrasound guided, interscalene  block. Side rails up, monitors on throughout procedure. See vital signs in flow sheet. Tolerated Procedure well. 

## 2019-01-26 NOTE — Transfer of Care (Signed)
Immediate Anesthesia Transfer of Care Note  Patient: Lauren Sutton  Procedure(s) Performed: OPEN REDUCTION INTERNAL FIXATION (ORIF) PROXIMAL HUMERUS FRACTURE (Left )  Patient Location: PACU  Anesthesia Type:General  Level of Consciousness: awake, alert  and patient cooperative  Airway & Oxygen Therapy: Patient Spontanous Breathing and Patient connected to face mask oxygen  Post-op Assessment: Report given to RN and Post -op Vital signs reviewed and stable  Post vital signs: Reviewed and stable  Last Vitals:  Vitals Value Taken Time  BP 147/109 01/26/19 1756  Temp    Pulse 95 01/26/19 1758  Resp 12 01/26/19 1758  SpO2 100 % 01/26/19 1758  Vitals shown include unvalidated device data.  Last Pain:  Vitals:   01/26/19 1500  TempSrc:   PainSc: 0-No pain      Patients Stated Pain Goal: 3 (29/52/84 1324)  Complications: No apparent anesthesia complications

## 2019-01-26 NOTE — Discharge Instructions (Signed)
° °Kevin M. Supple, M.D., F.A.A.O.S. °Orthopaedic Surgery °Specializing in Arthroscopic and Reconstructive °Surgery of the Shoulder °[phone] °[address] - [place], [place] [address] - Fax [phone] ° ° °POST-OP TOTAL SHOULDER REPLACEMENT INSTRUCTIONS ° °1. Call the office at [phone] to schedule your first post-op appointment 10-14 days from the date of your surgery. ° °2. The bandage over your incision is waterproof. You may begin showering with this dressing on. You may leave this dressing on until first follow up appointment within 2 weeks. We prefer you leave this dressing in place until follow up however after 5-7 days if you are having itching or skin irritation and would like to remove it you may do so. Go slow and tug at the borders gently to break the bond the dressing has with the skin. At this point if there is no drainage it is okay to go without a bandage or you may cover it with a light guaze and tape. You can also expect significant bruising around your shoulder that will drift down your arm and into your chest wall. This is very normal and should resolve over several days. ° ° 3. Wear your sling/immobilizer at all times except to perform the exercises below or to occasionally let your arm dangle by your side to stretch your elbow. You also need to sleep in your sling immobilizer until instructed otherwise. It is ok to remove your sling if you are sitting in a controlled environment and allow your arm to rest in a position of comfort by your side or on your lap with pillows to give your neck and skin a break from the sling. You may remove it to allow arm to dangle by side to shower. If you are up walking around and when you go to sleep at night you need to wear it. ° °4. Range of motion to your elbow, wrist, and hand are encouraged 3-5 times daily. Exercise to your hand and fingers helps to reduce swelling you may experience. ° °5. Utilize ice to the shoulder 3-5 times  minimum a day and additionally if you are experiencing pain. ° °6. Prescriptions for a pain medication and a muscle relaxant are provided for you. It is recommended that if you are experiencing pain that you pain medication alone is not controlling, add the muscle relaxant along with the pain medication which can give additional pain relief. The first 1-2 days is generally the most severe of your pain and then should gradually decrease. As your pain lessens it is recommended that you decrease your use of the pain medications to an "as needed basis'" only and to always comply with the recommended dosages of the pain medications. ° °7. Pain medications can produce constipation along with their use. If you experience this, the use of an over the counter stool softener or laxative daily is recommended.  ° °8. For additional questions or concerns, please do not hesitate to call the office. If after hours there is an answering service to forward your concerns to the physician on call. ° °9.Pain control following an exparel block ° °To help control your post-operative pain you received a nerve block  performed with Exparel which is a long acting anesthetic (numbing agent) which can provide pain relief and sensations of numbness (and relief of pain) in the operative shoulder and arm for up to 3 days. Sometimes it provides mixed relief, meaning you may still have numbness in certain areas of the arm but can still   be able to move  parts of that arm, hand, and fingers. We recommend that your prescribed pain medications  be used as needed. We do not feel it is necessary to "pre medicate" and "stay ahead" of pain.  Taking narcotic pain medications when you are not having any pain can lead to unnecessary and potentially dangerous side effects.    POST-OP EXERCISES  Ok to allow arm to dangle and move elbow wrist and hand

## 2019-01-26 NOTE — Op Note (Signed)
01/26/2019  5:36 PM  PATIENT:   Lauren Sutton  53 y.o. female  PRE-OPERATIVE DIAGNOSIS: Displaced left proximal humerus fracture  POST-OPERATIVE DIAGNOSIS: Same  PROCEDURE: Open reduction and internal fixation of displaced left 2 part proximal humerus fracture with application of allograft bone graft to the fracture site  SURGEON:  Han Vejar, Metta Clines M.D.  ASSISTANTS: Jenetta Loges, PA-C  ANESTHESIA:   General endotracheal and interscalene block with Exparel  EBL: 200 cc  SPECIMEN: None  Drains: None   PATIENT DISPOSITION:  PACU - hemodynamically stable.    PLAN OF CARE: Discharge to home after PACU  Brief history:  Ms. Shippey is a 53 year old female who sustained a ground level fall injuring her left shoulder.  Radiographs in our office demonstrated a moderately displaced and angulated left 2 part proximal humerus fracture.  Due to the degree of angulation and displacement she is brought to the operating this time for planned open reduction and internal fixation  Preoperatively I did counseled Ms. Petros regarding treatment options as well as the potential risks versus benefits thereof.  Possible surgical complications were reviewed including bleeding, infection, neurovascular injury, persistent pain, loss of motion, malunion, nonunion, loss of fixation, anesthetic complication, and possible need for additional surgery.  She understands and accepts and agrees with our planned procedure.  Procedure in detail:  After undergoing routine preop evaluation patient received prophylactic antibiotics and interscalene block with Exparel was established in the holding area by the anesthesia department.  Placed supine on the operating table and was induction of a general endotracheal anesthesia.  Placed in the beachchair position and appropriately padded and protected.  Fluoroscopic imaging was then used to confirm that the left shoulder could be properly visualized in 2 orthogonal  planes.  The left shoulder girdle region was then sterilely prepped and draped in standard fashion.  Timeout was called.  An anterior deltopectoral approach was made through a 10 cm incision.  Skin flaps are elevated dissection carried deeply deltopectoral interval was then developed proximal to distal with the deltoid retracted laterally along with the vein.  We then dissected beneath the deltoid says that we could identify the fracture site and placed an elevator within the fracture site to elevate the humeral head back into appropriate position and alignment.  We provisionally fastened a Biomet proximal humeral plate to the anterolateral cortex with a bone-holding clamp and then used fluoroscopic imaging to finalize our reduction and the position of the plate and ultimately we are pleased with the alignment although there was some difficulty gaining complete elevation of the humeral head and this was accomplished utilizing a Crago elevator through the fracture site and then under direct visualization we passed our guidepin up into the center of the humeral head and proper positioning was then confirmed.  We then placed a series of pegs up into the humeral head maintaining overall alignment we then completed fixation with screws into the shaft distally.  Upon completion the overall position alignment was much to our satisfaction.  Final fluoroscopic imaging showed good position of the hardware and good alignment of the fracture site.  We then utilized 5 cc of stay graft bone graft material to pack the fracture site.  The wounds then copiously irrigated.  The deltopectoral interval was then reapproximated with a series of figure-of-eight and 1 Vicryl sutures.  2-0 Vicryl used for the subcu layer and intracuticular 3-0 Monocryl for the skin followed by Dermabond and Aquasol dressing the left arm was then placed into  a sling and the patient was awakened, extubated, and taken to recovery room in stable  condition.  Ralene Batheracy Shuford, PA-C was used as an Geophysicist/field seismologistassistant throughout this case essential for help with positioning the patient, positioning the extremity, tissue manipulation, maintenance of the reduction, wound closure, and intraoperative decision-making.  Vania ReaKevin M Savion Washam MD   Contact # [phone]

## 2019-01-26 NOTE — Anesthesia Postprocedure Evaluation (Signed)
Anesthesia Post Note  Patient: Lauren Sutton  Procedure(s) Performed: OPEN REDUCTION INTERNAL FIXATION (ORIF) PROXIMAL HUMERUS FRACTURE (Left )     Patient location during evaluation: PACU Anesthesia Type: General and Regional Level of consciousness: awake and alert Pain management: pain level controlled Vital Signs Assessment: post-procedure vital signs reviewed and stable Respiratory status: spontaneous breathing, nonlabored ventilation, respiratory function stable and patient connected to nasal cannula oxygen Cardiovascular status: blood pressure returned to baseline and stable Postop Assessment: no apparent nausea or vomiting Anesthetic complications: no    Last Vitals:  Vitals:   01/26/19 1815 01/26/19 1830  BP: (!) 145/97 (!) 142/92  Pulse: 81 79  Resp: 12 15  Temp:    SpO2: 98% 97%    Last Pain:  Vitals:   01/26/19 1830  TempSrc:   PainSc: 0-No pain                 Effie Berkshire

## 2019-01-26 NOTE — Anesthesia Preprocedure Evaluation (Addendum)
Anesthesia Evaluation  Patient identified by MRN, date of birth, ID band Patient awake    Reviewed: Allergy & Precautions, NPO status , Patient's Chart, lab work & pertinent test results  History of Anesthesia Complications (+) PONV  Airway Mallampati: II  TM Distance: >3 FB Neck ROM: Full    Dental  (+) Teeth Intact, Dental Advisory Given   Pulmonary neg pulmonary ROS,    breath sounds clear to auscultation       Cardiovascular negative cardio ROS   Rhythm:Regular Rate:Normal     Neuro/Psych Anxiety    GI/Hepatic negative GI ROS, Neg liver ROS,   Endo/Other  negative endocrine ROS  Renal/GU negative Renal ROS     Musculoskeletal   Abdominal Normal abdominal exam  (+)   Peds  Hematology   Anesthesia Other Findings   Reproductive/Obstetrics                            Lab Results  Component Value Date   WBC 6.7 01/25/2019   HGB 12.0 01/25/2019   HCT 37.4 01/25/2019   MCV 92.6 01/25/2019   PLT 190 01/25/2019   Anesthesia Physical Anesthesia Plan  ASA: II  Anesthesia Plan: General   Post-op Pain Management: GA combined w/ Regional for post-op pain   Induction: Intravenous  PONV Risk Score and Plan: 4 or greater and Ondansetron, Dexamethasone, Midazolam and Scopolamine patch - Pre-op  Airway Management Planned: Oral ETT  Additional Equipment: None  Intra-op Plan:   Post-operative Plan: Extubation in OR  Informed Consent: I have reviewed the patients History and Physical, chart, labs and discussed the procedure including the risks, benefits and alternatives for the proposed anesthesia with the patient or authorized representative who has indicated his/her understanding and acceptance.     Dental advisory given  Plan Discussed with: CRNA  Anesthesia Plan Comments:        Anesthesia Quick Evaluation

## 2019-01-26 NOTE — H&P (Signed)
Lauren Sutton    Chief Complaint: left proximal humerus fracture HPI: The patient is a 54 y.o. female status post ground-level fall sustaining a displaced and angulated left 2 part proximal humerus fracture.  Due to the degree of displacement she is brought to the operating this time for planned open reduction and internal fixation  Past Medical History:  Diagnosis Date  . Abnormal Pap smear 2005  . Anxiety   . Complication of anesthesia   . H/O insomnia 2011  . H/O migraine 2005  . H/O migraine   . IBS (irritable bowel syndrome) 2009  . Neuromuscular disorder ([place])    neuropathy bi lat. feet. takes gabapentin  . PONV (postoperative nausea and vomiting)   . Yeast infection     Past Surgical History:  Procedure Laterality Date  . WISDOM TOOTH EXTRACTION      Family History  Problem Relation Age of Onset  . Cancer Paternal Grandmother        Breast  . Hypertension Mother   . Stroke Mother   . Neuropathy Neg Hx     Social History:  reports that she has never smoked. She has never used smokeless tobacco. She reports that she does not drink alcohol or use drugs.   Medications Prior to Admission  Medication Sig Dispense Refill  . diclofenac (VOLTAREN) 75 MG EC tablet Take 75 mg by mouth 2 (two) times daily as needed (pain/inflammation.).    Marland Kitchen gabapentin (NEURONTIN) 800 MG tablet Take 800 mg by mouth at bedtime.    Marland Kitchen HYDROcodone-acetaminophen (NORCO/VICODIN) 5-325 MG tablet Take 1 tablet by mouth every 6 (six) hours as needed (pain.).    Marland Kitchen Multiple Vitamin (MULTIVITAMIN WITH MINERALS) TABS tablet Take 1 tablet by mouth at bedtime.    . ondansetron (ZOFRAN) 4 MG tablet Take 4 mg by mouth every 8 (eight) hours as needed for nausea or vomiting.    . Probiotic Product (PROBIOTIC PO) Take 1 capsule by mouth at bedtime.    . vitamin C (ASCORBIC ACID) 500 MG tablet Take 1,000 mg by mouth at bedtime.    Marland Kitchen VITAMIN D PO Take 2 drops by mouth daily.       Physical Exam: Left shoulder  inspection demonstrates diffuse swelling.  Globally tender to palpation.  Neurovascular intact distally.  Examination is otherwise as documented in the office earlier this week.  Radiographs  Plain film x-rays of the left shoulder demonstrate a significantly angulated 2 part proximal humerus fracture.  Vitals  Temp:  [98.4 F (36.9 C)] 98.4 F (36.9 C) (06/18 1405) Pulse Rate:  [75] 75 (06/18 1405) Resp:  [5-21] 16 (06/18 1522) BP: (99-123)/(59-81) 102/59 (06/18 1522) SpO2:  [100 %] 100 % (06/18 1405) Weight:  [65.7 kg] 65.7 kg (06/18 1405)  Assessment/Plan  Impression: left proximal humerus fracture  Plan of Action: Procedure(s): OPEN REDUCTION INTERNAL FIXATION (ORIF) PROXIMAL HUMERUS FRACTURE  Eh Sauseda M Maryanne Huneycutt 01/26/2019, 3:54 PM Contact # [phone]

## 2019-01-26 NOTE — Anesthesia Procedure Notes (Signed)
Anesthesia Regional Block: Interscalene brachial plexus block   Pre-Anesthetic Checklist: ,, timeout performed, Correct Patient, Correct Site, Correct Laterality, Correct Procedure, Correct Position, site marked, Risks and benefits discussed,  Surgical consent,  Pre-op evaluation,  At surgeon's request and post-op pain management  Laterality: Left  Prep: chloraprep       Needles:  Injection technique: Single-shot  Needle Type: Echogenic Stimulator Needle     Needle Length: 9cm  Needle Gauge: 21     Additional Needles:   Procedures:,,,, ultrasound used (permanent image in chart),,,,  Narrative:  Start time: 01/26/2019 3:05 PM End time: 01/26/2019 3:15 PM Injection made incrementally with aspirations every 5 mL.  Performed by: Personally  Anesthesiologist: Effie Berkshire, MD  Additional Notes: Patient tolerated the procedure well. Local anesthetic introduced in an incremental fashion under minimal resistance after negative aspirations. No paresthesias were elicited. After completion of the procedure, no acute issues were identified and patient continued to be monitored by RN.

## 2019-01-27 ENCOUNTER — Encounter (HOSPITAL_COMMUNITY): Payer: Self-pay | Admitting: Orthopedic Surgery

## 2019-01-27 DIAGNOSIS — S42202A Unspecified fracture of upper end of left humerus, initial encounter for closed fracture: Secondary | ICD-10-CM | POA: Diagnosis not present

## 2019-01-27 DIAGNOSIS — W1830XA Fall on same level, unspecified, initial encounter: Secondary | ICD-10-CM | POA: Diagnosis not present

## 2019-01-27 MED ORDER — CYCLOBENZAPRINE HCL 10 MG PO TABS: 10.0000 mg | ORAL_TABLET | Freq: Three times a day (TID) | ORAL | 1 refills | Status: AC | PRN

## 2019-01-27 MED ORDER — HYDROCODONE-ACETAMINOPHEN 5-325 MG PO TABS: 1.0000 | ORAL_TABLET | Freq: Four times a day (QID) | ORAL | 0 refills | Status: AC | PRN

## 2019-01-27 MED ORDER — ONDANSETRON HCL 4 MG PO TABS: 4.0000 mg | ORAL_TABLET | Freq: Three times a day (TID) | ORAL | 0 refills | Status: AC | PRN

## 2019-01-27 NOTE — Evaluation (Signed)
Occupational Therapy Evaluation Patient Details Name: Isaac BlissOlga C Schnick MRN: [id number] DOB: 1966-01-01 Today's Date: 01/27/2019    History of Present Illness ORIF L proximal humeral fx   Clinical Impression   Pt requires min A with UB and LB ADLs and is independent - sup with ADL transfers. Pt instructed on shoulder protocol/sling wear, ADL compensatory techniques, R UE positioning and PROM allowed for ADLs per MD with handout provided. Pt will have assist at home from her husband. All education completed and no further acute OT is indicated at this time     Follow Up Recommendations  Follow surgeon's recommendation for DC plan and follow-up therapies    Equipment Recommendations  None recommended by OT    Recommendations for Other Services       Precautions / Restrictions Precautions Precautions: Shoulder Type of Shoulder Precautions: PROM for ADLs only:ER 10, ADB 45, FE 60 Shoulder Interventions: Shoulder sling/immobilizer;Off for dressing/bathing/exercises;At all times Precaution Booklet Issued: Yes (comment) Required Braces or Orthoses: Sling Restrictions Weight Bearing Restrictions: Yes LUE Weight Bearing: Non weight bearing      Mobility Bed Mobility Overal bed mobility: Modified Independent                Transfers Overall transfer level: Independent Equipment used: None Transfers: Sit to/from Stand Sit to Stand: Independent;Supervision              Balance Overall balance assessment: No apparent balance deficits (not formally assessed)                                         ADL either performed or assessed with clinical judgement   ADL Overall ADL's : Needs assistance/impaired Eating/Feeding: Independent   Grooming: Wash/dry hands;Wash/dry face;Standing;Supervision/safety   Upper Body Bathing: Minimal assistance   Lower Body Bathing: Minimal assistance   Upper Body Dressing : Minimal assistance   Lower Body Dressing:  Minimal assistance   Toilet Transfer: Independent   Toileting- Clothing Manipulation and Hygiene: Minimal assistance   Tub/ Shower Transfer: Supervision/safety   Functional mobility during ADLs: Supervision/safety;Independent General ADL Comments: sup for safety with walk in shower transfer     Vision Baseline Vision/History: No visual deficits Patient Visual Report: No change from baseline       Perception     Praxis      Pertinent Vitals/Pain Pain Assessment: No/denies pain     Hand Dominance Right   Extremity/Trunk Assessment Upper Extremity Assessment Upper Extremity Assessment: LUE deficits/detail LUE Deficits / Details: sling, PROM for ADLs only per MD LUE: Unable to fully assess due to pain       Cervical / Trunk Assessment Cervical / Trunk Assessment: Normal   Communication Communication Communication: No difficulties   Cognition Arousal/Alertness: Awake/alert Behavior During Therapy: WFL for tasks assessed/performed Overall Cognitive Status: Within Functional Limits for tasks assessed                                     General Comments       Exercises     Shoulder Instructions      Home Living Family/patient expects to be discharged to:: Private residence Living Arrangements: Spouse/significant other;Children Available Help at Discharge: Family Type of Home: House Home Access: Stairs to enter Entergy CorporationEntrance Stairs-Number of Steps: 2  Bathroom Shower/Tub: Tub/shower unit;Walk-in shower   Bathroom Toilet: Standard     Home Equipment: None          Prior Functioning/Environment Level of Independence: Independent                 OT Problem List: Decreased strength;Decreased activity tolerance;Impaired UE functional use      OT Treatment/Interventions:      OT Goals(Current goals can be found in the care plan section) Acute Rehab OT Goals Patient Stated Goal: go home OT Goal Formulation: With patient  OT  Frequency:     Barriers to D/C:            Co-evaluation              AM-PAC OT "6 Clicks" Daily Activity     Outcome Measure Help from another person eating meals?: None Help from another person taking care of personal grooming?: A Little Help from another person toileting, which includes using toliet, bedpan, or urinal?: A Little Help from another person bathing (including washing, rinsing, drying)?: A Little Help from another person to put on and taking off regular upper body clothing?: A Little Help from another person to put on and taking off regular lower body clothing?: A Little 6 Click Score: 19   End of Session Equipment Utilized During Treatment: Other (comment)(L UE sling)  Activity Tolerance:   Patient left: in chair;with call bell/phone within reach  OT Visit Diagnosis: Muscle weakness (generalized) (M62.81)                Time: 9702-6378 OT Time Calculation (min): 43 min Charges:  OT General Charges $OT Visit: 1 Visit OT Evaluation $OT Eval Low Complexity: 1 Low OT Treatments $Self Care/Home Management : 8-22 mins $Therapeutic Activity: 8-22 mins    Emmit Alexanders [facility] 01/27/2019, 12:00 PM

## 2019-01-27 NOTE — Plan of Care (Signed)
  Problem: Education: Goal: Knowledge of General Education information will improve Description: Including pain rating scale, medication(s)/side effects and non-pharmacologic comfort measures Outcome: Adequate for Discharge   Problem: Health Behavior/Discharge Planning: Goal: Ability to manage health-related needs will improve Outcome: Adequate for Discharge   Problem: Clinical Measurements: Goal: Ability to maintain clinical measurements within normal limits will improve Outcome: Adequate for Discharge Goal: Will remain free from infection Outcome: Adequate for Discharge Goal: Diagnostic test results will improve Outcome: Adequate for Discharge Goal: Respiratory complications will improve Outcome: Adequate for Discharge Goal: Cardiovascular complication will be avoided Outcome: Adequate for Discharge   Problem: Activity: Goal: Risk for activity intolerance will decrease Outcome: Adequate for Discharge   Problem: Nutrition: Goal: Adequate nutrition will be maintained Outcome: Adequate for Discharge   Problem: Coping: Goal: Level of anxiety will decrease Outcome: Adequate for Discharge   Problem: Elimination: Goal: Will not experience complications related to bowel motility Outcome: Adequate for Discharge Goal: Will not experience complications related to urinary retention Outcome: Adequate for Discharge   Problem: Pain Managment: Goal: General experience of comfort will improve Outcome: Adequate for Discharge   Problem: Safety: Goal: Ability to remain free from injury will improve Outcome: Adequate for Discharge   Problem: Skin Integrity: Goal: Risk for impaired skin integrity will decrease Outcome: Adequate for Discharge   Problem: Education: Goal: Knowledge of the prescribed therapeutic regimen will improve Outcome: Adequate for Discharge Goal: Understanding of activity limitations/precautions following surgery will improve Outcome: Adequate for  Discharge Goal: Individualized Educational Video(s) Outcome: Adequate for Discharge   Problem: Activity: Goal: Ability to tolerate increased activity will improve Outcome: Adequate for Discharge   Problem: Pain Management: Goal: Pain level will decrease with appropriate interventions Outcome: Adequate for Discharge  Home with family today. Discharge teaching done.

## 2019-01-27 NOTE — Plan of Care (Signed)
?  Problem: Education: ?Goal: Knowledge of General Education information will improve ?Description: Including pain rating scale, medication(s)/side effects and non-pharmacologic comfort measures ?Outcome: Progressing ?  ?Problem: Health Behavior/Discharge Planning: ?Goal: Ability to manage health-related needs will improve ?Outcome: Progressing ?  ?Problem: Clinical Measurements: ?Goal: Will remain free from infection ?Outcome: Progressing ?  ?Problem: Clinical Measurements: ?Goal: Diagnostic test results will improve ?Outcome: Progressing ?  ?Problem: Clinical Measurements: ?Goal: Respiratory complications will improve ?Outcome: Progressing ?  ?Problem: Clinical Measurements: ?Goal: Cardiovascular complication will be avoided ?Outcome: Progressing ?  ?

## 2019-01-27 NOTE — Discharge Summary (Signed)
PATIENT ID:      Lauren BlissOlga C Sutton  MRN:     [id number] DOB/AGE:    1965/12/07 / 53 y.o.     DISCHARGE SUMMARY  ADMISSION DATE:    01/26/2019 DISCHARGE DATE:    ADMISSION DIAGNOSIS: left proximal humerus fracture Past Medical History:  Diagnosis Date  . Abnormal Pap smear 2005  . Anxiety   . Complication of anesthesia   . H/O insomnia 2011  . H/O migraine 2005  . H/O migraine   . IBS (irritable bowel syndrome) 2009  . Neuromuscular disorder ([facility])    neuropathy bi lat. feet. takes gabapentin  . PONV (postoperative nausea and vomiting)   . Yeast infection     DISCHARGE DIAGNOSIS:   Active Problems:   Closed fracture of left proximal humerus   Proximal humerus fracture   PROCEDURE: Procedure(s): OPEN REDUCTION INTERNAL FIXATION (ORIF) PROXIMAL HUMERUS FRACTURE on 01/26/2019  CONSULTS:    HISTORY:  See H&P in chart.  HOSPITAL COURSE:  Lauren Sutton is a 53 y.o. admitted on 01/26/2019 with a diagnosis of left proximal humerus fracture.  They were brought to the operating room on 01/26/2019 and underwent Procedure(s): OPEN REDUCTION INTERNAL FIXATION (ORIF) PROXIMAL HUMERUS FRACTURE.    They were given perioperative antibiotics:  Anti-infectives (From admission, onward)   Start     Dose/Rate Route Frequency Ordered Stop   01/27/19 0600  ceFAZolin (ANCEF) IVPB 2g/100 mL premix  Status:  Discontinued     2 g 200 mL/hr over 30 Minutes Intravenous On call to O.R. 01/26/19 1359 01/26/19 1401   01/26/19 1415  ceFAZolin (ANCEF) IVPB 2g/100 mL premix     2 g 200 mL/hr over 30 Minutes Intravenous On call to O.R. 01/26/19 1402 01/26/19 1626    .  Patient underwent the above named procedure and tolerated it well. The following day they were hemodynamically stable and pain was controlled on oral analgesics. They were neurovascularly intact to the operative extremity. OT was ordered and worked with patient per protocol. They were medically and orthopaedically stable for discharge on  .    DIAGNOSTIC STUDIES:  RECENT RADIOGRAPHIC STUDIES :  Dg Shoulder 1v Left  Result Date: 01/26/2019 CLINICAL DATA:  Left shoulder fracture EXAM: LEFT SHOULDER - 1 VIEW COMPARISON:  01/23/2019 FINDINGS: Fourteen low resolution intraoperative spot views of the left shoulder. Total fluoroscopy time was 2 minutes 12 seconds. Acute comminuted left humeral neck fracture. Subsequent placement of surgical plate and multiple fixating screws within the proximal humerus. Anatomic alignment. IMPRESSION: Intraoperative fluoroscopic assistance provided during surgical fixation of proximal left humerus fracture Electronically Signed   By: Jasmine PangKim  Fujinaga M.D.   On: 01/26/2019 20:17   Dg C-arm 1-60 Min-no Report  Result Date: 01/26/2019 Fluoroscopy was utilized by the requesting physician.  No radiographic interpretation.    RECENT VITAL SIGNS:   Patient Vitals for the past 24 hrs:  BP Temp Temp src Pulse Resp SpO2 Height Weight  01/27/19 0449 109/75 98.6 F (37 C) Oral 69 16 99 % - -  01/27/19 0107 95/63 98.3 F (36.8 C) Oral 72 14 100 % - -  01/26/19 2149 118/86 98.4 F (36.9 C) Oral 86 16 100 % - -  01/26/19 2053 117/88 98.9 F (37.2 C) Oral 85 16 100 % - -  01/26/19 1950 (!) 133/94 97.8 F (36.6 C) Oral 91 16 100 % - -  01/26/19 1851 (!) 136/92 98.2 F (36.8 C) Oral 79 15 100 % - -  01/26/19  1830 (!) 142/92 98.8 F (37.1 C) - 79 15 97 % - -  01/26/19 1815 (!) 145/97 - - 81 12 98 % - -  01/26/19 1806 (!) 138/98 - - - - - - -  01/26/19 1800 (!) 150/107 - - 93 13 100 % - -  01/26/19 1757 (!) 147/109 98.8 F (37.1 C) - 83 14 100 % - -  01/26/19 1522 (!) 102/59 - - - 16 - - -  01/26/19 1517 99/63 - - - 20 - - -  01/26/19 1515 - - - - (!) 21 - - -  01/26/19 1512 101/69 - - - 20 - - -  01/26/19 1507 116/74 - - - (!) 5 - - -  01/26/19 1502 119/81 - - - 12 - - -  01/26/19 1405 123/79 98.4 F (36.9 C) Oral 75 18 100 % 5\' 1"  (1.549 m) 65.7 kg  .  RECENT EKG RESULTS:   No orders found for this  or any previous visit.  DISCHARGE INSTRUCTIONS:    DISCHARGE MEDICATIONS:   Allergies as of 01/27/2019   No Known Allergies     Medication List    TAKE these medications   cyclobenzaprine 10 MG tablet Commonly known as: FLEXERIL Take 1 tablet (10 mg total) by mouth 3 (three) times daily as needed for muscle spasms.   diclofenac 75 MG EC tablet Commonly known as: VOLTAREN Take 75 mg by mouth 2 (two) times daily as needed (pain/inflammation.).   gabapentin 800 MG tablet Commonly known as: NEURONTIN Take 800 mg by mouth at bedtime.   HYDROcodone-acetaminophen 5-325 MG tablet Commonly known as: NORCO/VICODIN Take 1 tablet by mouth every 6 (six) hours as needed (pain.).   multivitamin with minerals Tabs tablet Take 1 tablet by mouth at bedtime.   ondansetron 4 MG tablet Commonly known as: ZOFRAN Take 4 mg by mouth every 8 (eight) hours as needed for nausea or vomiting. What changed: Another medication with the same name was added. Make sure you understand how and when to take each.   ondansetron 4 MG tablet Commonly known as: Zofran Take 1 tablet (4 mg total) by mouth every 8 (eight) hours as needed for nausea or vomiting. What changed: You were already taking a medication with the same name, and this prescription was added. Make sure you understand how and when to take each.   PROBIOTIC PO Take 1 capsule by mouth at bedtime.   vitamin C 500 MG tablet Commonly known as: ASCORBIC ACID Take 1,000 mg by mouth at bedtime.   VITAMIN D PO Take 2 drops by mouth daily.       FOLLOW UP VISIT:   Follow-up Information    Justice Britain, MD.   Specialty: Orthopedic Surgery Why: call to be seen in 10-14 days Contact information: [address] [place] [address] [phone]           DISCHARGE TO: Home   DISCHARGE CONDITION:  Thereasa Parkin Dawnn Nam for Dr. Justice Britain 01/27/2019, 8:31 AM

## 2019-02-06 DIAGNOSIS — S42202D Unspecified fracture of upper end of left humerus, subsequent encounter for fracture with routine healing: Secondary | ICD-10-CM | POA: Diagnosis not present

## 2019-02-10 DIAGNOSIS — Z4889 Encounter for other specified surgical aftercare: Secondary | ICD-10-CM | POA: Diagnosis not present

## 2019-02-10 DIAGNOSIS — S42202A Unspecified fracture of upper end of left humerus, initial encounter for closed fracture: Secondary | ICD-10-CM | POA: Diagnosis not present

## 2019-02-10 DIAGNOSIS — I9789 Other postprocedural complications and disorders of the circulatory system, not elsewhere classified: Secondary | ICD-10-CM | POA: Diagnosis not present

## 2019-02-10 DIAGNOSIS — M79622 Pain in left upper arm: Secondary | ICD-10-CM | POA: Diagnosis not present

## 2019-03-08 DIAGNOSIS — M25522 Pain in left elbow: Secondary | ICD-10-CM | POA: Diagnosis not present

## 2019-03-08 DIAGNOSIS — S42202D Unspecified fracture of upper end of left humerus, subsequent encounter for fracture with routine healing: Secondary | ICD-10-CM | POA: Diagnosis not present

## 2019-03-14 DIAGNOSIS — M25512 Pain in left shoulder: Secondary | ICD-10-CM | POA: Diagnosis not present

## 2019-03-15 DIAGNOSIS — M25512 Pain in left shoulder: Secondary | ICD-10-CM | POA: Diagnosis not present

## 2019-03-20 DIAGNOSIS — M25512 Pain in left shoulder: Secondary | ICD-10-CM | POA: Diagnosis not present

## 2019-03-22 DIAGNOSIS — M25512 Pain in left shoulder: Secondary | ICD-10-CM | POA: Diagnosis not present

## 2019-03-27 DIAGNOSIS — M25512 Pain in left shoulder: Secondary | ICD-10-CM | POA: Diagnosis not present

## 2019-03-29 DIAGNOSIS — M25512 Pain in left shoulder: Secondary | ICD-10-CM | POA: Diagnosis not present

## 2019-04-03 DIAGNOSIS — M25512 Pain in left shoulder: Secondary | ICD-10-CM | POA: Diagnosis not present

## 2019-04-05 DIAGNOSIS — M25512 Pain in left shoulder: Secondary | ICD-10-CM | POA: Diagnosis not present

## 2019-04-10 DIAGNOSIS — M25512 Pain in left shoulder: Secondary | ICD-10-CM | POA: Diagnosis not present

## 2019-04-12 DIAGNOSIS — M25512 Pain in left shoulder: Secondary | ICD-10-CM | POA: Diagnosis not present

## 2019-04-18 DIAGNOSIS — M25512 Pain in left shoulder: Secondary | ICD-10-CM | POA: Diagnosis not present

## 2019-04-19 DIAGNOSIS — S42202D Unspecified fracture of upper end of left humerus, subsequent encounter for fracture with routine healing: Secondary | ICD-10-CM | POA: Diagnosis not present

## 2019-04-20 DIAGNOSIS — M25512 Pain in left shoulder: Secondary | ICD-10-CM | POA: Diagnosis not present

## 2019-04-25 DIAGNOSIS — M25512 Pain in left shoulder: Secondary | ICD-10-CM | POA: Diagnosis not present

## 2019-05-01 DIAGNOSIS — M25512 Pain in left shoulder: Secondary | ICD-10-CM | POA: Diagnosis not present

## 2019-05-03 DIAGNOSIS — M25512 Pain in left shoulder: Secondary | ICD-10-CM | POA: Diagnosis not present

## 2019-05-08 DIAGNOSIS — M25512 Pain in left shoulder: Secondary | ICD-10-CM | POA: Diagnosis not present

## 2019-05-11 DIAGNOSIS — M25512 Pain in left shoulder: Secondary | ICD-10-CM | POA: Diagnosis not present

## 2019-05-15 DIAGNOSIS — M79671 Pain in right foot: Secondary | ICD-10-CM | POA: Diagnosis not present

## 2019-05-15 DIAGNOSIS — M79672 Pain in left foot: Secondary | ICD-10-CM | POA: Diagnosis not present

## 2019-05-15 DIAGNOSIS — M25572 Pain in left ankle and joints of left foot: Secondary | ICD-10-CM | POA: Diagnosis not present

## 2019-05-15 DIAGNOSIS — M25571 Pain in right ankle and joints of right foot: Secondary | ICD-10-CM | POA: Diagnosis not present

## 2019-05-17 DIAGNOSIS — M25512 Pain in left shoulder: Secondary | ICD-10-CM | POA: Diagnosis not present

## 2019-05-22 DIAGNOSIS — M25512 Pain in left shoulder: Secondary | ICD-10-CM | POA: Diagnosis not present

## 2019-05-24 DIAGNOSIS — M25512 Pain in left shoulder: Secondary | ICD-10-CM | POA: Diagnosis not present

## 2019-05-29 DIAGNOSIS — M25512 Pain in left shoulder: Secondary | ICD-10-CM | POA: Diagnosis not present

## 2019-05-30 DIAGNOSIS — M79672 Pain in left foot: Secondary | ICD-10-CM | POA: Diagnosis not present

## 2019-05-30 DIAGNOSIS — M79671 Pain in right foot: Secondary | ICD-10-CM | POA: Diagnosis not present

## 2019-05-31 DIAGNOSIS — S42202D Unspecified fracture of upper end of left humerus, subsequent encounter for fracture with routine healing: Secondary | ICD-10-CM | POA: Diagnosis not present

## 2019-05-31 DIAGNOSIS — Z4789 Encounter for other orthopedic aftercare: Secondary | ICD-10-CM | POA: Diagnosis not present

## 2019-06-07 DIAGNOSIS — M25512 Pain in left shoulder: Secondary | ICD-10-CM | POA: Diagnosis not present

## 2019-06-09 DIAGNOSIS — M79672 Pain in left foot: Secondary | ICD-10-CM | POA: Diagnosis not present

## 2019-06-12 DIAGNOSIS — M7741 Metatarsalgia, right foot: Secondary | ICD-10-CM | POA: Diagnosis not present

## 2019-06-12 DIAGNOSIS — M7742 Metatarsalgia, left foot: Secondary | ICD-10-CM | POA: Diagnosis not present

## 2019-06-12 DIAGNOSIS — G609 Hereditary and idiopathic neuropathy, unspecified: Secondary | ICD-10-CM | POA: Diagnosis not present

## 2019-06-12 DIAGNOSIS — M6701 Short Achilles tendon (acquired), right ankle: Secondary | ICD-10-CM | POA: Diagnosis not present

## 2019-06-13 DIAGNOSIS — M25512 Pain in left shoulder: Secondary | ICD-10-CM | POA: Diagnosis not present

## 2019-06-15 DIAGNOSIS — M79671 Pain in right foot: Secondary | ICD-10-CM | POA: Diagnosis not present

## 2019-06-15 DIAGNOSIS — M79672 Pain in left foot: Secondary | ICD-10-CM | POA: Diagnosis not present

## 2019-06-20 DIAGNOSIS — M79671 Pain in right foot: Secondary | ICD-10-CM | POA: Diagnosis not present

## 2019-06-20 DIAGNOSIS — M79672 Pain in left foot: Secondary | ICD-10-CM | POA: Diagnosis not present

## 2019-06-21 DIAGNOSIS — M25512 Pain in left shoulder: Secondary | ICD-10-CM | POA: Diagnosis not present

## 2019-06-23 DIAGNOSIS — M25512 Pain in left shoulder: Secondary | ICD-10-CM | POA: Diagnosis not present

## 2019-06-28 DIAGNOSIS — S42202D Unspecified fracture of upper end of left humerus, subsequent encounter for fracture with routine healing: Secondary | ICD-10-CM | POA: Diagnosis not present

## 2019-07-24 DIAGNOSIS — Z1211 Encounter for screening for malignant neoplasm of colon: Secondary | ICD-10-CM | POA: Diagnosis not present

## 2019-07-24 DIAGNOSIS — Z1212 Encounter for screening for malignant neoplasm of rectum: Secondary | ICD-10-CM | POA: Diagnosis not present

## 2019-08-15 DIAGNOSIS — L820 Inflamed seborrheic keratosis: Secondary | ICD-10-CM | POA: Diagnosis not present

## 2019-10-05 DIAGNOSIS — E559 Vitamin D deficiency, unspecified: Secondary | ICD-10-CM | POA: Diagnosis not present

## 2019-10-05 DIAGNOSIS — R7989 Other specified abnormal findings of blood chemistry: Secondary | ICD-10-CM | POA: Diagnosis not present

## 2019-10-05 DIAGNOSIS — R7309 Other abnormal glucose: Secondary | ICD-10-CM | POA: Diagnosis not present

## 2019-10-05 DIAGNOSIS — Z0000 Encounter for general adult medical examination without abnormal findings: Secondary | ICD-10-CM | POA: Diagnosis not present

## 2019-10-27 DIAGNOSIS — Z0000 Encounter for general adult medical examination without abnormal findings: Secondary | ICD-10-CM | POA: Diagnosis not present

## 2019-10-27 DIAGNOSIS — G609 Hereditary and idiopathic neuropathy, unspecified: Secondary | ICD-10-CM | POA: Diagnosis not present

## 2019-10-27 DIAGNOSIS — R7309 Other abnormal glucose: Secondary | ICD-10-CM | POA: Diagnosis not present

## 2019-12-27 DIAGNOSIS — Z23 Encounter for immunization: Secondary | ICD-10-CM | POA: Diagnosis not present

## 2020-01-24 DIAGNOSIS — Z23 Encounter for immunization: Secondary | ICD-10-CM | POA: Diagnosis not present

## 2020-01-31 DIAGNOSIS — B029 Zoster without complications: Secondary | ICD-10-CM | POA: Diagnosis not present

## 2020-07-13 IMAGING — RF LEFT SHOULDER - 1 VIEW
1 series · 14 of 14 positions shown · non-contrast
Comparison: 01/23/2019

CLINICAL DATA: Left shoulder fracture

EXAM:
LEFT SHOULDER - 1 VIEW

[Series 1: unknown protocol · 0.14mm/px · 14 of 14 slices shown]
[im 1/14]
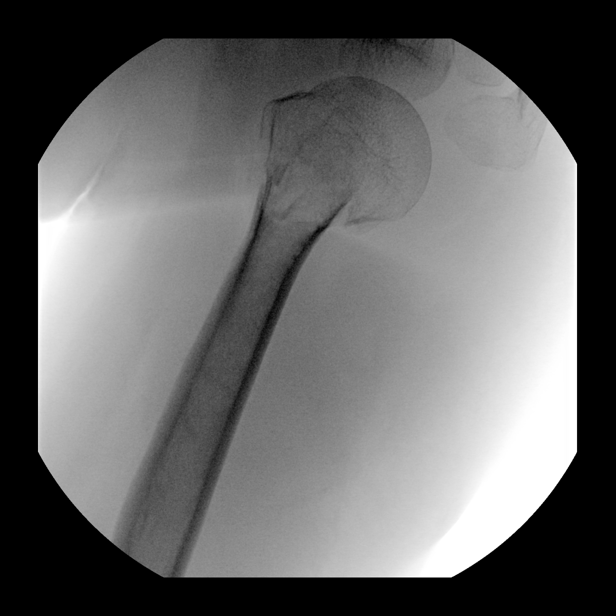
[im 2/14]
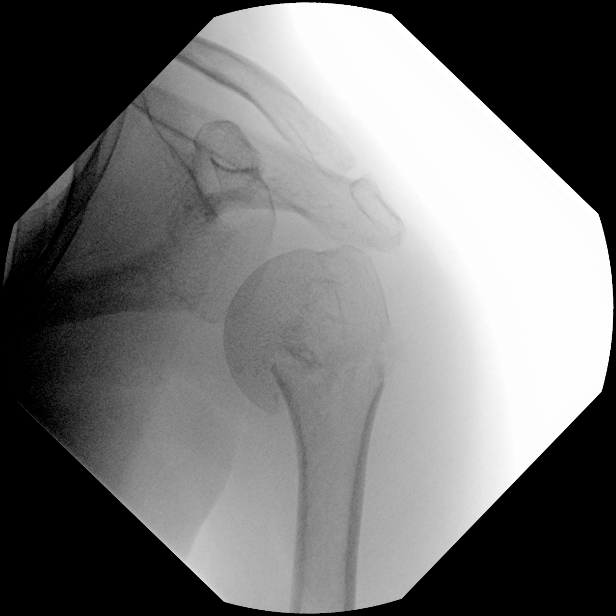
[im 3/14]
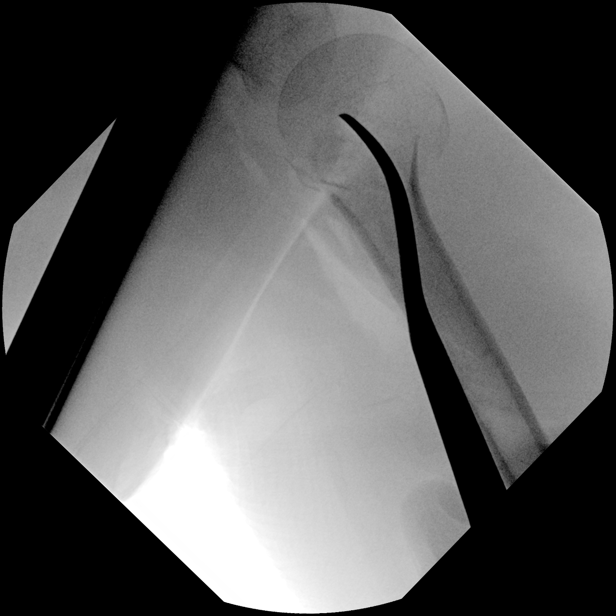
[im 4/14]
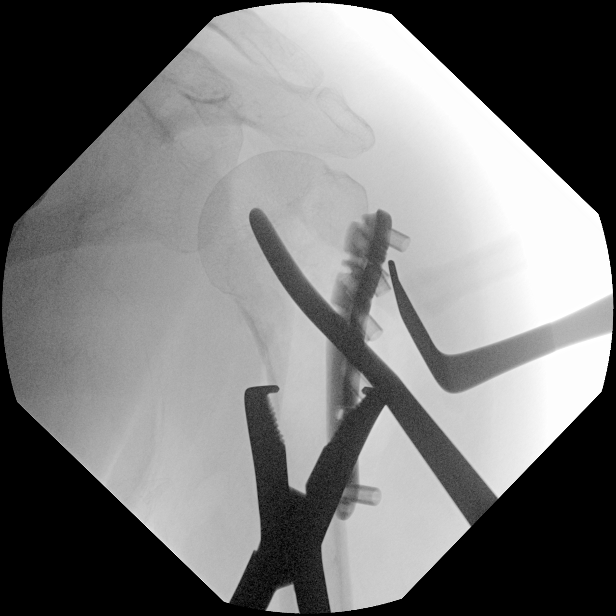
[im 5/14]
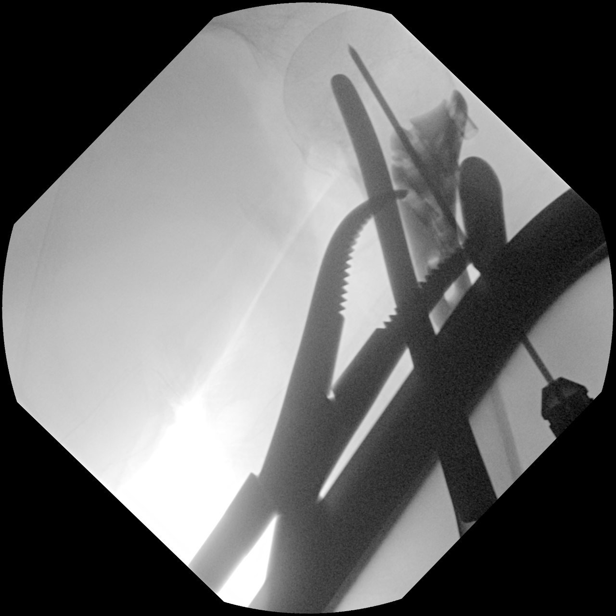
[im 6/14]
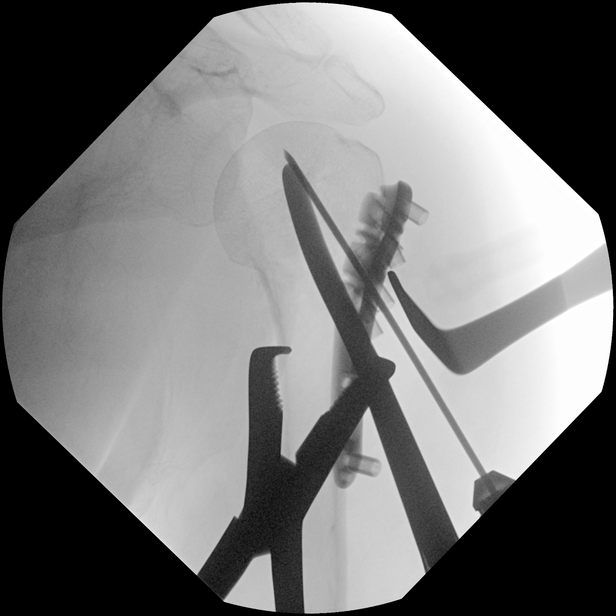
[im 7/14]
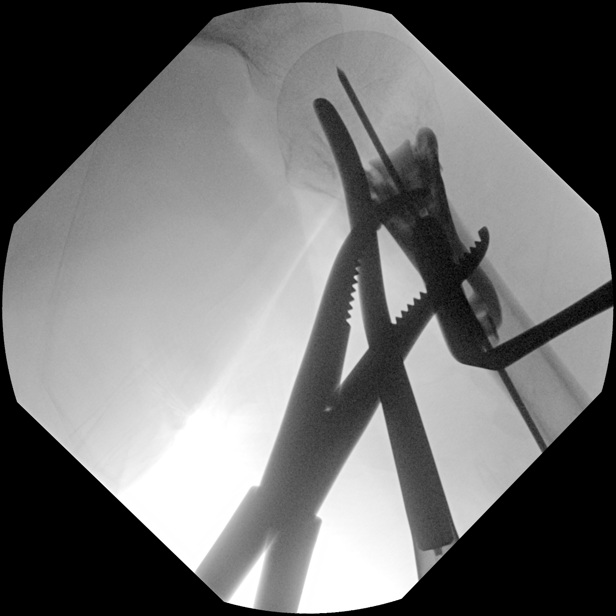
[im 8/14]
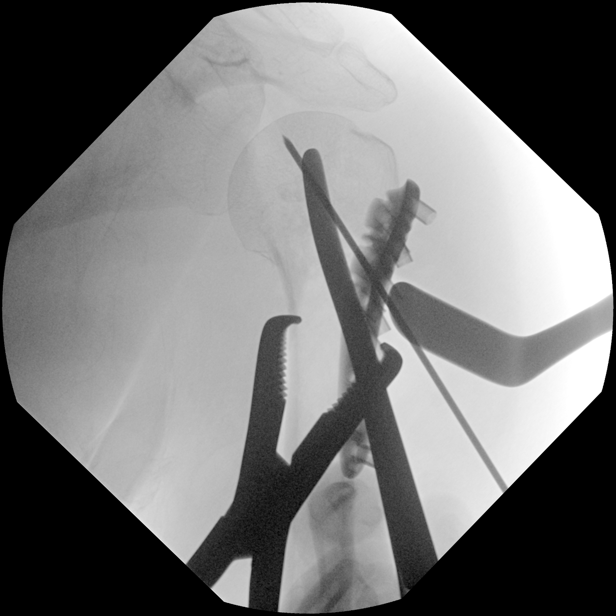
[im 9/14]
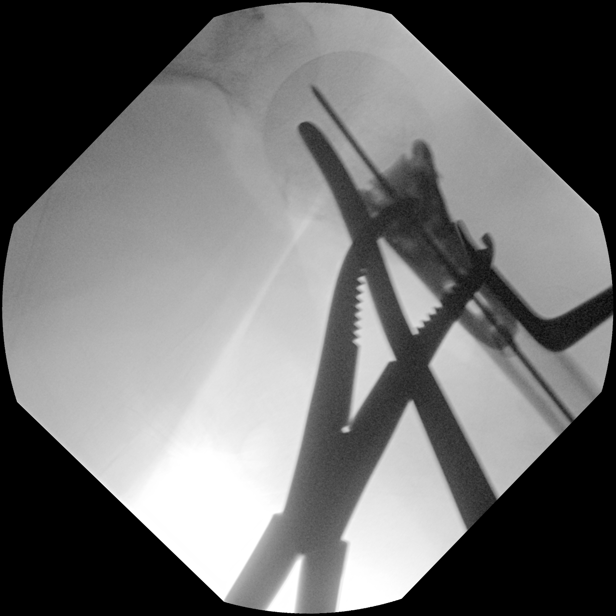
[im 10/14]
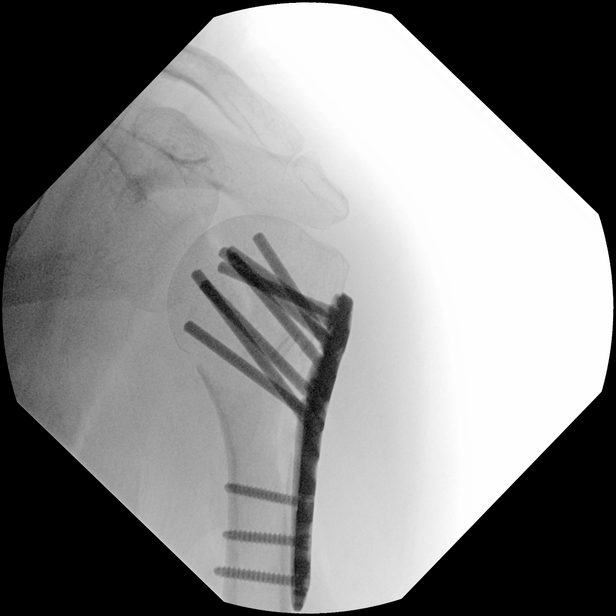
[im 11/14]
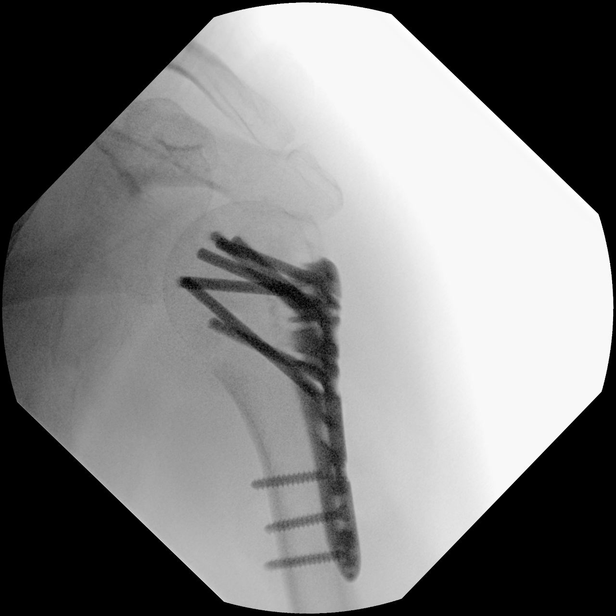
[im 12/14]
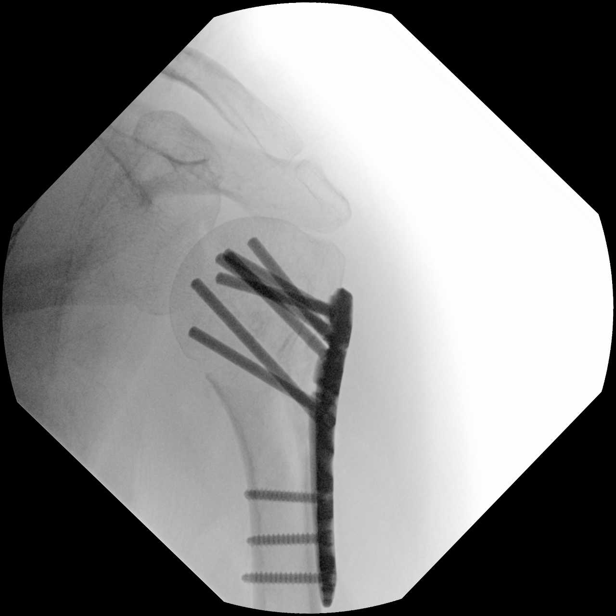
[im 13/14]
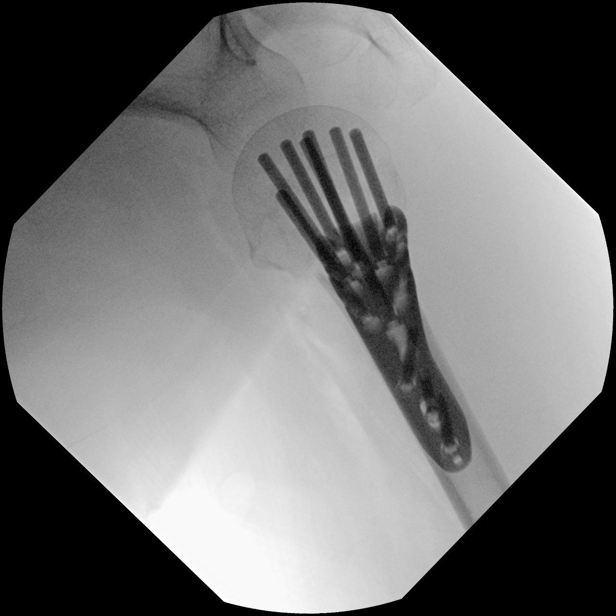
[im 14/14]
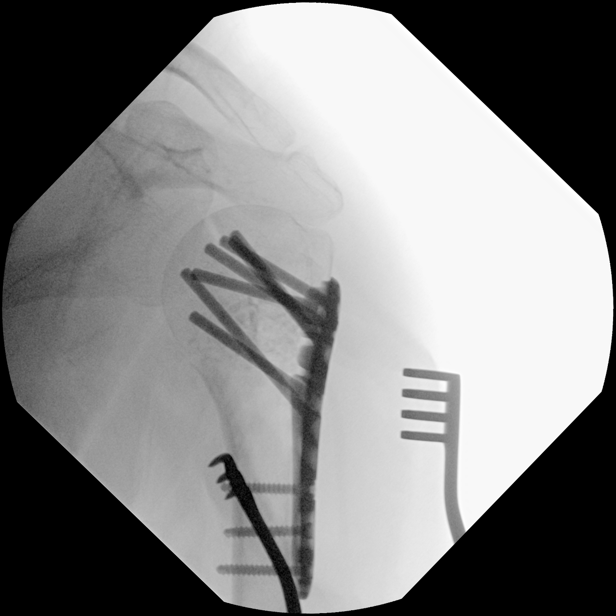

[14 of 14 positions shown; findings below may reference images not displayed]

FINDINGS: Fourteen low resolution intraoperative spot views of the left
shoulder. Total fluoroscopy time was 2 minutes 12 seconds. Acute
comminuted left humeral neck fracture. Subsequent placement of
surgical plate and multiple fixating screws within the proximal
humerus. Anatomic alignment.
IMPRESSION: Intraoperative fluoroscopic assistance provided during surgical
fixation of proximal left humerus fracture

## 2020-11-06 DIAGNOSIS — Z0000 Encounter for general adult medical examination without abnormal findings: Secondary | ICD-10-CM | POA: Diagnosis not present

## 2020-11-06 DIAGNOSIS — R7309 Other abnormal glucose: Secondary | ICD-10-CM | POA: Diagnosis not present

## 2020-11-06 DIAGNOSIS — R7989 Other specified abnormal findings of blood chemistry: Secondary | ICD-10-CM | POA: Diagnosis not present

## 2020-11-06 DIAGNOSIS — N390 Urinary tract infection, site not specified: Secondary | ICD-10-CM | POA: Diagnosis not present

## 2020-11-06 DIAGNOSIS — Z13220 Encounter for screening for lipoid disorders: Secondary | ICD-10-CM | POA: Diagnosis not present

## 2020-11-14 DIAGNOSIS — G609 Hereditary and idiopathic neuropathy, unspecified: Secondary | ICD-10-CM | POA: Diagnosis not present

## 2020-11-14 DIAGNOSIS — Z0000 Encounter for general adult medical examination without abnormal findings: Secondary | ICD-10-CM | POA: Diagnosis not present

## 2020-11-14 DIAGNOSIS — Z6828 Body mass index (BMI) 28.0-28.9, adult: Secondary | ICD-10-CM | POA: Diagnosis not present

## 2020-11-14 DIAGNOSIS — J329 Chronic sinusitis, unspecified: Secondary | ICD-10-CM | POA: Diagnosis not present

## 2021-05-20 DIAGNOSIS — N950 Postmenopausal bleeding: Secondary | ICD-10-CM | POA: Diagnosis not present

## 2021-05-20 DIAGNOSIS — H1131 Conjunctival hemorrhage, right eye: Secondary | ICD-10-CM | POA: Diagnosis not present

## 2021-05-20 DIAGNOSIS — Z6825 Body mass index (BMI) 25.0-25.9, adult: Secondary | ICD-10-CM | POA: Diagnosis not present

## 2021-05-20 DIAGNOSIS — Z1211 Encounter for screening for malignant neoplasm of colon: Secondary | ICD-10-CM | POA: Diagnosis not present

## 2021-05-20 DIAGNOSIS — Z01419 Encounter for gynecological examination (general) (routine) without abnormal findings: Secondary | ICD-10-CM | POA: Diagnosis not present

## 2021-05-20 DIAGNOSIS — Z1231 Encounter for screening mammogram for malignant neoplasm of breast: Secondary | ICD-10-CM | POA: Diagnosis not present

## 2021-06-23 DIAGNOSIS — N950 Postmenopausal bleeding: Secondary | ICD-10-CM | POA: Diagnosis not present

## 2021-11-13 DIAGNOSIS — Z0000 Encounter for general adult medical examination without abnormal findings: Secondary | ICD-10-CM | POA: Diagnosis not present

## 2021-11-13 DIAGNOSIS — E559 Vitamin D deficiency, unspecified: Secondary | ICD-10-CM | POA: Diagnosis not present

## 2021-11-13 DIAGNOSIS — R7989 Other specified abnormal findings of blood chemistry: Secondary | ICD-10-CM | POA: Diagnosis not present

## 2021-11-13 DIAGNOSIS — R7309 Other abnormal glucose: Secondary | ICD-10-CM | POA: Diagnosis not present

## 2021-11-20 DIAGNOSIS — R7989 Other specified abnormal findings of blood chemistry: Secondary | ICD-10-CM | POA: Diagnosis not present

## 2021-11-20 DIAGNOSIS — Z8669 Personal history of other diseases of the nervous system and sense organs: Secondary | ICD-10-CM | POA: Diagnosis not present

## 2021-11-20 DIAGNOSIS — Z0000 Encounter for general adult medical examination without abnormal findings: Secondary | ICD-10-CM | POA: Diagnosis not present

## 2021-11-20 DIAGNOSIS — E781 Pure hyperglyceridemia: Secondary | ICD-10-CM | POA: Diagnosis not present

## 2022-01-28 DIAGNOSIS — L821 Other seborrheic keratosis: Secondary | ICD-10-CM | POA: Diagnosis not present

## 2022-01-28 DIAGNOSIS — D485 Neoplasm of uncertain behavior of skin: Secondary | ICD-10-CM | POA: Diagnosis not present

## 2022-05-29 DIAGNOSIS — Z23 Encounter for immunization: Secondary | ICD-10-CM | POA: Diagnosis not present

## 2022-06-24 DIAGNOSIS — Z01419 Encounter for gynecological examination (general) (routine) without abnormal findings: Secondary | ICD-10-CM | POA: Diagnosis not present

## 2022-06-24 DIAGNOSIS — Z6823 Body mass index (BMI) 23.0-23.9, adult: Secondary | ICD-10-CM | POA: Diagnosis not present

## 2022-06-24 DIAGNOSIS — Z1231 Encounter for screening mammogram for malignant neoplasm of breast: Secondary | ICD-10-CM | POA: Diagnosis not present

## 2022-06-24 DIAGNOSIS — Z1211 Encounter for screening for malignant neoplasm of colon: Secondary | ICD-10-CM | POA: Diagnosis not present

## 2022-11-20 DIAGNOSIS — Z0000 Encounter for general adult medical examination without abnormal findings: Secondary | ICD-10-CM | POA: Diagnosis not present

## 2022-11-20 DIAGNOSIS — R7303 Prediabetes: Secondary | ICD-10-CM | POA: Diagnosis not present

## 2022-11-20 DIAGNOSIS — E781 Pure hyperglyceridemia: Secondary | ICD-10-CM | POA: Diagnosis not present

## 2022-11-20 DIAGNOSIS — R7989 Other specified abnormal findings of blood chemistry: Secondary | ICD-10-CM | POA: Diagnosis not present

## 2022-11-26 DIAGNOSIS — E559 Vitamin D deficiency, unspecified: Secondary | ICD-10-CM | POA: Diagnosis not present

## 2022-11-26 DIAGNOSIS — Z23 Encounter for immunization: Secondary | ICD-10-CM | POA: Diagnosis not present

## 2022-11-26 DIAGNOSIS — R7989 Other specified abnormal findings of blood chemistry: Secondary | ICD-10-CM | POA: Diagnosis not present

## 2022-11-26 DIAGNOSIS — Z79899 Other long term (current) drug therapy: Secondary | ICD-10-CM | POA: Diagnosis not present

## 2022-11-26 DIAGNOSIS — Z0000 Encounter for general adult medical examination without abnormal findings: Secondary | ICD-10-CM | POA: Diagnosis not present

## 2022-11-26 DIAGNOSIS — R7303 Prediabetes: Secondary | ICD-10-CM | POA: Diagnosis not present

## 2022-11-26 DIAGNOSIS — G609 Hereditary and idiopathic neuropathy, unspecified: Secondary | ICD-10-CM | POA: Diagnosis not present

## 2022-12-10 DIAGNOSIS — Z1212 Encounter for screening for malignant neoplasm of rectum: Secondary | ICD-10-CM | POA: Diagnosis not present

## 2022-12-10 DIAGNOSIS — Z1211 Encounter for screening for malignant neoplasm of colon: Secondary | ICD-10-CM | POA: Diagnosis not present

## 2022-12-18 LAB — COLOGUARD: COLOGUARD: NEGATIVE

## 2023-03-04 DIAGNOSIS — Z23 Encounter for immunization: Secondary | ICD-10-CM | POA: Diagnosis not present

## 2023-07-06 DIAGNOSIS — Z124 Encounter for screening for malignant neoplasm of cervix: Secondary | ICD-10-CM | POA: Diagnosis not present

## 2023-07-06 DIAGNOSIS — Z01419 Encounter for gynecological examination (general) (routine) without abnormal findings: Secondary | ICD-10-CM | POA: Diagnosis not present

## 2023-07-06 DIAGNOSIS — Z1211 Encounter for screening for malignant neoplasm of colon: Secondary | ICD-10-CM | POA: Diagnosis not present

## 2023-07-06 DIAGNOSIS — Z1231 Encounter for screening mammogram for malignant neoplasm of breast: Secondary | ICD-10-CM | POA: Diagnosis not present

## 2023-07-06 DIAGNOSIS — Z6823 Body mass index (BMI) 23.0-23.9, adult: Secondary | ICD-10-CM | POA: Diagnosis not present

## 2023-07-06 DIAGNOSIS — Z1330 Encounter for screening examination for mental health and behavioral disorders, unspecified: Secondary | ICD-10-CM | POA: Diagnosis not present

## 2023-11-23 DIAGNOSIS — Z79899 Other long term (current) drug therapy: Secondary | ICD-10-CM | POA: Diagnosis not present

## 2023-11-23 DIAGNOSIS — R7989 Other specified abnormal findings of blood chemistry: Secondary | ICD-10-CM | POA: Diagnosis not present

## 2023-11-23 DIAGNOSIS — E559 Vitamin D deficiency, unspecified: Secondary | ICD-10-CM | POA: Diagnosis not present

## 2023-11-23 DIAGNOSIS — R7303 Prediabetes: Secondary | ICD-10-CM | POA: Diagnosis not present

## 2023-11-30 DIAGNOSIS — E559 Vitamin D deficiency, unspecified: Secondary | ICD-10-CM | POA: Diagnosis not present

## 2023-11-30 DIAGNOSIS — R7303 Prediabetes: Secondary | ICD-10-CM | POA: Diagnosis not present

## 2023-11-30 DIAGNOSIS — G609 Hereditary and idiopathic neuropathy, unspecified: Secondary | ICD-10-CM | POA: Diagnosis not present

## 2023-11-30 DIAGNOSIS — Z0000 Encounter for general adult medical examination without abnormal findings: Secondary | ICD-10-CM | POA: Diagnosis not present

## 2023-11-30 DIAGNOSIS — Z79899 Other long term (current) drug therapy: Secondary | ICD-10-CM | POA: Diagnosis not present

## 2023-12-03 DIAGNOSIS — M9902 Segmental and somatic dysfunction of thoracic region: Secondary | ICD-10-CM | POA: Diagnosis not present

## 2023-12-03 DIAGNOSIS — M9903 Segmental and somatic dysfunction of lumbar region: Secondary | ICD-10-CM | POA: Diagnosis not present

## 2023-12-03 DIAGNOSIS — M9901 Segmental and somatic dysfunction of cervical region: Secondary | ICD-10-CM | POA: Diagnosis not present

## 2023-12-03 DIAGNOSIS — M9905 Segmental and somatic dysfunction of pelvic region: Secondary | ICD-10-CM | POA: Diagnosis not present

## 2023-12-16 DIAGNOSIS — M9905 Segmental and somatic dysfunction of pelvic region: Secondary | ICD-10-CM | POA: Diagnosis not present

## 2023-12-16 DIAGNOSIS — M9902 Segmental and somatic dysfunction of thoracic region: Secondary | ICD-10-CM | POA: Diagnosis not present

## 2023-12-16 DIAGNOSIS — M9901 Segmental and somatic dysfunction of cervical region: Secondary | ICD-10-CM | POA: Diagnosis not present

## 2023-12-16 DIAGNOSIS — M9903 Segmental and somatic dysfunction of lumbar region: Secondary | ICD-10-CM | POA: Diagnosis not present

## 2024-07-18 DIAGNOSIS — Z01419 Encounter for gynecological examination (general) (routine) without abnormal findings: Secondary | ICD-10-CM | POA: Diagnosis not present

## 2024-07-18 DIAGNOSIS — Z1231 Encounter for screening mammogram for malignant neoplasm of breast: Secondary | ICD-10-CM | POA: Diagnosis not present

## 2024-07-18 DIAGNOSIS — Z6823 Body mass index (BMI) 23.0-23.9, adult: Secondary | ICD-10-CM | POA: Diagnosis not present

## 2024-07-18 DIAGNOSIS — Z1211 Encounter for screening for malignant neoplasm of colon: Secondary | ICD-10-CM | POA: Diagnosis not present
